# Patient Record
Sex: Male | Born: 1955 | Race: Black or African American | Hispanic: No | Marital: Single | Smoking: Former smoker
Health system: Southern US, Community
[De-identification: ages and names within clinical notes are randomized; demographics above are authoritative.]

---

## 2004-05-07 ENCOUNTER — Emergency Department (HOSPITAL_COMMUNITY): Admission: EM | Admit: 2004-05-07 | Discharge: 2004-05-07 | Payer: Self-pay | Admitting: Emergency Medicine

## 2009-10-23 ENCOUNTER — Emergency Department: Payer: Self-pay | Admitting: Emergency Medicine

## 2014-02-11 ENCOUNTER — Observation Stay: Payer: Self-pay | Admitting: Specialist

## 2014-02-11 LAB — URINALYSIS, COMPLETE
Bacteria: NONE SEEN
Bilirubin,UR: NEGATIVE
Blood: NEGATIVE
Glucose,UR: NEGATIVE mg/dL (ref 0–75)
Hyaline Cast: 8
Ketone: NEGATIVE
Nitrite: NEGATIVE
Ph: 5 (ref 4.5–8.0)
Protein: NEGATIVE
RBC,UR: 4 /HPF (ref 0–5)
Specific Gravity: 1.015 (ref 1.003–1.030)
Squamous Epithelial: 1
WBC UR: 40 /HPF (ref 0–5)

## 2014-02-11 LAB — BASIC METABOLIC PANEL
Anion Gap: 6 — ABNORMAL LOW (ref 7–16)
BUN: 19 mg/dL — ABNORMAL HIGH (ref 7–18)
Calcium, Total: 7.2 mg/dL — ABNORMAL LOW (ref 8.5–10.1)
Chloride: 103 mmol/L (ref 98–107)
Co2: 24 mmol/L (ref 21–32)
Creatinine: 1.34 mg/dL — ABNORMAL HIGH (ref 0.60–1.30)
EGFR (African American): >60
EGFR (Non-African Amer.): 59 — ABNORMAL LOW
Glucose: 80 mg/dL (ref 65–99)
Osmolality: 268 (ref 275–301)
Potassium: 4 mmol/L (ref 3.5–5.1)
Sodium: 133 mmol/L — ABNORMAL LOW (ref 136–145)

## 2014-02-11 LAB — ETHANOL
Ethanol %: 0.083 % — ABNORMAL HIGH (ref 0.000–0.080)
Ethanol: 83 mg/dL

## 2014-02-11 LAB — COMPREHENSIVE METABOLIC PANEL
Albumin: 3.7 g/dL (ref 3.4–5.0)
Alkaline Phosphatase: 72 U/L
Anion Gap: 12 (ref 7–16)
BUN: 29 mg/dL — ABNORMAL HIGH (ref 7–18)
Bilirubin,Total: 0.8 mg/dL (ref 0.2–1.0)
Calcium, Total: 8.8 mg/dL (ref 8.5–10.1)
Chloride: 93 mmol/L — ABNORMAL LOW (ref 98–107)
Co2: 23 mmol/L (ref 21–32)
Creatinine: 2.7 mg/dL — ABNORMAL HIGH (ref 0.60–1.30)
EGFR (African American): 29 — ABNORMAL LOW
EGFR (Non-African Amer.): 25 — ABNORMAL LOW
Glucose: 96 mg/dL (ref 65–99)
Osmolality: 263 (ref 275–301)
Potassium: 5 mmol/L (ref 3.5–5.1)
SGOT(AST): 26 U/L (ref 15–37)
SGPT (ALT): 22 U/L
Sodium: 128 mmol/L — ABNORMAL LOW (ref 136–145)
Total Protein: 8.4 g/dL — ABNORMAL HIGH (ref 6.4–8.2)

## 2014-02-11 LAB — TROPONIN I
Troponin-I: <0.02 ng/mL
Troponin-I: <0.02 ng/mL
Troponin-I: <0.02 ng/mL

## 2014-02-11 LAB — CBC
HCT: 50.5 % (ref 40.0–52.0)
HGB: 16.8 g/dL (ref 13.0–18.0)
MCH: 29.9 pg (ref 26.0–34.0)
MCHC: 33.3 g/dL (ref 32.0–36.0)
MCV: 90 fL (ref 80–100)
Platelet: 169 10*3/uL (ref 150–440)
RBC: 5.63 10*6/uL (ref 4.40–5.90)
RDW: 14.9 % — ABNORMAL HIGH (ref 11.5–14.5)
WBC: 11.8 10*3/uL — ABNORMAL HIGH (ref 3.8–10.6)

## 2014-02-11 LAB — CK-MB
CK-MB: 0.6 ng/mL (ref 0.5–3.6)
CK-MB: 1 ng/mL (ref 0.5–3.6)

## 2014-02-11 LAB — DRUG SCREEN, URINE

## 2014-02-11 LAB — CK TOTAL AND CKMB (NOT AT ARMC)
CK, Total: 199 U/L
CK-MB: <0.5 ng/mL — ABNORMAL LOW (ref 0.5–3.6)

## 2014-02-12 ENCOUNTER — Inpatient Hospital Stay: Payer: Self-pay | Admitting: Psychiatry

## 2014-02-12 LAB — LIPID PANEL
Cholesterol: 97 mg/dL (ref 0–200)
HDL Cholesterol: 39 mg/dL — ABNORMAL LOW (ref 40–60)
Ldl Cholesterol, Calc: 40 mg/dL (ref 0–100)
Triglycerides: 89 mg/dL (ref 0–200)
VLDL Cholesterol, Calc: 18 mg/dL (ref 5–40)

## 2014-02-12 LAB — TSH: Thyroid Stimulating Horm: 1.3 u[IU]/mL

## 2014-02-13 LAB — FOLATE: Folic Acid: 14.8 ng/mL (ref 3.1–100.0)

## 2014-02-14 LAB — BASIC METABOLIC PANEL
Anion Gap: 8 (ref 7–16)
BUN: 7 mg/dL (ref 7–18)
Calcium, Total: 8.7 mg/dL (ref 8.5–10.1)
Chloride: 105 mmol/L (ref 98–107)
Co2: 28 mmol/L (ref 21–32)
Creatinine: 0.91 mg/dL (ref 0.60–1.30)
EGFR (African American): >60
EGFR (Non-African Amer.): >60
Glucose: 89 mg/dL (ref 65–99)
Osmolality: 279 (ref 275–301)
Potassium: 4 mmol/L (ref 3.5–5.1)
Sodium: 141 mmol/L (ref 136–145)

## 2014-02-28 LAB — CBC
HCT: 46.5 % (ref 40.0–52.0)
HGB: 15.4 g/dL (ref 13.0–18.0)
MCH: 29.8 pg (ref 26.0–34.0)
MCHC: 33 g/dL (ref 32.0–36.0)
MCV: 90 fL (ref 80–100)
Platelet: 223 10*3/uL (ref 150–440)
RBC: 5.15 10*6/uL (ref 4.40–5.90)
RDW: 15.3 % — ABNORMAL HIGH (ref 11.5–14.5)
WBC: 9.2 10*3/uL (ref 3.8–10.6)

## 2014-02-28 LAB — DRUG SCREEN, URINE
Amphetamines, Ur Screen: NEGATIVE (ref ?–1000)
Barbiturates, Ur Screen: NEGATIVE (ref ?–200)
Benzodiazepine, Ur Scrn: NEGATIVE (ref ?–200)
Cannabinoid 50 Ng, Ur ~~LOC~~: POSITIVE (ref ?–50)
Cocaine Metabolite,Ur ~~LOC~~: NEGATIVE (ref ?–300)
MDMA (Ecstasy)Ur Screen: NEGATIVE (ref ?–500)
Methadone, Ur Screen: NEGATIVE (ref ?–300)
Opiate, Ur Screen: NEGATIVE (ref ?–300)
Phencyclidine (PCP) Ur S: NEGATIVE (ref ?–25)
Tricyclic, Ur Screen: NEGATIVE (ref ?–1000)

## 2014-02-28 LAB — TSH: Thyroid Stimulating Horm: 2.04 u[IU]/mL

## 2014-02-28 LAB — COMPREHENSIVE METABOLIC PANEL
Albumin: 3.3 g/dL — ABNORMAL LOW (ref 3.4–5.0)
Alkaline Phosphatase: 74 U/L
Anion Gap: 9 (ref 7–16)
BUN: 6 mg/dL — ABNORMAL LOW (ref 7–18)
Bilirubin,Total: 0.7 mg/dL (ref 0.2–1.0)
Calcium, Total: 8.4 mg/dL — ABNORMAL LOW (ref 8.5–10.1)
Chloride: 98 mmol/L (ref 98–107)
Co2: 25 mmol/L (ref 21–32)
Creatinine: 0.9 mg/dL (ref 0.60–1.30)
EGFR (African American): >60
EGFR (Non-African Amer.): >60
Glucose: 119 mg/dL — ABNORMAL HIGH (ref 65–99)
Osmolality: 263 (ref 275–301)
Potassium: 3.3 mmol/L — ABNORMAL LOW (ref 3.5–5.1)
SGOT(AST): 22 U/L (ref 15–37)
SGPT (ALT): 14 U/L
Sodium: 132 mmol/L — ABNORMAL LOW (ref 136–145)
Total Protein: 7.5 g/dL (ref 6.4–8.2)

## 2014-02-28 LAB — URINALYSIS, COMPLETE
Bacteria: NONE SEEN
Bilirubin,UR: NEGATIVE
Blood: NEGATIVE
Glucose,UR: >=500 mg/dL (ref 0–75)
Hyaline Cast: 2
Ketone: NEGATIVE
Nitrite: NEGATIVE
Ph: 5 (ref 4.5–8.0)
Protein: NEGATIVE
RBC,UR: 4 /HPF (ref 0–5)
Specific Gravity: 1.016 (ref 1.003–1.030)
Squamous Epithelial: 1
WBC UR: 33 /HPF (ref 0–5)

## 2014-02-28 LAB — SALICYLATE LEVEL: Salicylates, Serum: 4.4 mg/dL — ABNORMAL HIGH

## 2014-02-28 LAB — ETHANOL
Ethanol %: <0.003 % (ref 0.000–0.080)
Ethanol: <3 mg/dL

## 2014-02-28 LAB — ACETAMINOPHEN LEVEL: Acetaminophen: <2 ug/mL

## 2014-03-01 ENCOUNTER — Inpatient Hospital Stay: Payer: Self-pay | Admitting: Psychiatry

## 2014-03-02 LAB — LIPID PANEL
Cholesterol: 130 mg/dL (ref 0–200)
HDL Cholesterol: 66 mg/dL — ABNORMAL HIGH (ref 40–60)
Ldl Cholesterol, Calc: 46 mg/dL (ref 0–100)
Triglycerides: 92 mg/dL (ref 0–200)
VLDL Cholesterol, Calc: 18 mg/dL (ref 5–40)

## 2014-03-02 LAB — HEMOGLOBIN A1C: Hemoglobin A1C: 5.9 % (ref 4.2–6.3)

## 2014-03-03 LAB — URINE CULTURE

## 2014-10-30 NOTE — Consult Note (Signed)
Psychiatry: After my first eval, nursing informed me patient is complaining of hallucinations and seems paranoid. On re-eval he agrees though he doesnt seem to remember it very well. I think this is prob dementia with psychotic symptoms "sundowning" . They are looking for placement. If we have a bed, which we may or may not, we can transfer him to psychiatry. Meanwhile continue haldol here as ordered. I did advise the psychiatry intake coordinator, though, that we can transfer him if possible.  Electronic Signatures: Gonzella Lex (MD)  (Signed on 06-Aug-15 16:42)  Authored  Last Updated: 06-Aug-15 16:42 by Gonzella Lex (MD)

## 2014-10-30 NOTE — H&P (Signed)
PATIENT NAME:  Nathan Mcintosh, Nathan Mcintosh MR#:  [id number] DATE OF BIRTH:  1955/11/22  DATE OF ADMISSION:  03/01/2014  REFERRING PHYSICIAN: Emergency Room MD   ATTENDING PHYSICIAN: Kristin Lamagna B. Bary Leriche, MD   IDENTIFYING DATA: Mr. Mcgibbon is a 59 year old male with history of alcoholism and mood instability.   CHIEF COMPLAINT: "I took pills."   HISTORY OF PRESENT ILLNESS: Mr. Slatter was hospitalized at [facility] for chest pain, alcohol detoxification and depression with psychotic features at the beginning of August. He was discharged from psychiatry on 08/11 with prescription for Prozac and Haldol for depression and hallucinations. He was discharged to the homeless shelter; however, he was not accepted there and returned to his previous poor living situation. For the past years, he has been living in an abandoned car. He has no income, no insurance and very little support in the area even though his daughter and his grandchildren live here. He did not take medications as prescribed following discharge, but overdosed on his 7 day supply prior to coming to the Emergency Room again. He complains of suicidal ideation and depression. He also reported auditory hallucinations, but was unable to make out the content of the voices. He really does not understand what made his situation so much worse, but he is tired of living and feels that he is unable to press on anymore. He reports many symptoms of depression with poor sleep, decreased appetite, anhedonia, feeling of guilt, worthlessness, hopelessness, crying spells, poor energy and concentration, social isolation and now suicidal ideation leading to a suicide attempt by overdose. The patient has a long history of drinking. He is without resources so has not been drinking much, maybe 2 or 3 beers a day. On some days, he gets drunk when he gets access to alcohol. He denies any other substance use. He denies symptoms suggestive of bipolar mania.    PAST PSYCHIATRIC HISTORY: He had 1 recent hospitalization for alcohol detoxification and psychotic depression. He did not follow up with [facility], mostly because he was no longer living at the shelter as planned. He is ready to start working with mental health provider and substance abuse counselor no. However, his social situation is of concern. I am still unsure if he would be able to go to the homeless shelter in [place]. If we send him to the shelter in [place] he will have no support of [facility] community support team. He denies ever attempting suicide before. He has never been in treatment for substance abuse.   FAMILY PSYCHIATRIC HISTORY: None reported.   PAST MEDICAL HISTORY: Failure to thrive.   ALLERGIES: No known drug allergies.   MEDICATIONS: On admission, none. He was prescribed Remeron 15 mg at bedtime and Haldol 2 mg at bedtime.   SOCIAL HISTORY: As above, he is unemployed, uninsured. He is homeless.   REVIEW OF SYSTEMS: CONSTITUTIONAL: No fevers or chills. Positive for weight loss and fatigue.  EYES: No double or blurred vision.  EARS, NOSE AND THROAT: No hearing loss.  RESPIRATORY: No shortness of breath or cough.  CARDIOVASCULAR: No chest pain or orthopnea.  GASTROINTESTINAL: No abdominal pain, nausea, vomiting or diarrhea.  GENITOURINARY: No incontinence or frequency.  ENDOCRINE: No heat or cold intolerance.  LYMPHATIC: No anemia or easy bruising.  INTEGUMENTARY: No acne or rash.  MUSCULOSKELETAL: No muscle or joint pain.  NEUROLOGIC: No tingling or weakness.  PSYCHIATRIC: See history of present illness for details.   PHYSICAL EXAMINATION:  VITAL SIGNS: Blood pressure 136/82, pulse  126, respirations 20, temperature 98.2.  GENERAL: This is an  undernourished man looking older than stated age, in no acute distress.  HEENT: The pupils are equal, round and reactive to light. Sclerae are anicteric.  NECK: Supple. No thyromegaly.  LUNGS: Clear to auscultation. No  dullness to percussion.  HEART: Regular rhythm and rate. No murmurs, rubs or gallops.  ABDOMEN: Soft, nontender, nondistended. Positive bowel sounds.  MUSCULOSKELETAL: Normal muscle strength in all extremities.  SKIN: No rashes or bruises.  LYMPHATIC: No cervical adenopathy.  NEUROLOGIC: Cranial nerves II-XII are intact.   LABORATORY DATA: Chemistries: Blood glucose 119, BUN 6, creatinine 0.9, sodium 132, potassium 3.3. Blood alcohol level is zero. LFTs within normal limits. TSH 2.04. Urine toxicology screen positive for cannabinoids. CBC within normal limits. Urinalysis: Glucose over 500, leukocyte esterase 1+, white cells per field 33. Serum acetaminophen less than 2. Serum salicylates 4.4.   MENTAL STATUS EXAMINATION ON ADMISSION: The patient is asleep but easily arousable. He is pleasant, polite and cooperative. He is adequately groomed, wearing hospital scrubs. He remembers me from previous admission. He maintains limited eye contact. His speech is soft. There is poverty of speech. Mood is depressed with flat affect. Thought process is logical and goal oriented. He denies thoughts of hurting himself or others and is able to contract for safety in the hospital, but came after a suicide attempt by overdose. He is not paranoid or delusional but endorses auditory hallucinations. His cognition is grossly intact. Registration, recall, short and long-term memory are okay. He is of average intelligence and fund of knowledge. His insight and judgment are limited.   SUICIDE RISK ASSESSMENT ON ADMISSION: This is a patient with a long history of alcoholism and new onset depression and psychosis.   INITIAL DIAGNOSES:  AXIS I: Major depressive disorder, recurrent, severe with psychotic features; alcohol dependence; cannabis abuse.  AXIS II: Deferred.  AXIS III: Failure to thrive, urinary tract infection.  AXIS IV: Mental and physical illness, access to care, employment, financial, housing, primary support.   AXIS V: Global assessment of functioning 25.   PLAN: The patient was admitted to [facility] for safety, stabilization and medication management. He was initially placed on suicide precautions and was closely monitored for any unsafe behaviors. He underwent full psychiatric and risk assessment. He received pharmacotherapy, individual and group psychotherapy, substance abuse counseling, and support from therapeutic milieu.  1.  Suicidal ideation: The patient is able to contract for safety.  2.  We will continue antidepressant and antipsychotic for now.  3.  UTI. We will obtain urine culture.  4.  Diabetes. We will obtain hemoglobin A1C and blood glucose monitoring to see what the appropriate treatment is.   DISPOSITION: To be established.   ____________________________ Wardell Honour. Bary Leriche, MD jbp:TT D: 03/01/2014 16:00:54 ET T: 03/01/2014 16:36:25 ET JOB#: [id number]  cc: Starsha Morning B. Bary Leriche, MD, <Dictator> Clovis Fredrickson MD ELECTRONICALLY SIGNED 03/02/2014 19:13

## 2014-10-30 NOTE — Consult Note (Signed)
Brief Consult Note: Diagnosis: alcohol abuse, dementia.   Patient was seen by consultant.   Consult note dictated.   Discussed with Attending MD.   Comments: Transfer to psychiatry.  Electronic Signatures: Orson Slick (MD)  (Signed 07-Aug-15 16:31)  Authored: Brief Consult Note   Last Updated: 07-Aug-15 16:31 by Orson Slick (MD)

## 2014-10-30 NOTE — Consult Note (Signed)
Psychiatry: Consult note for this patient who was brought into the emergency room complaining of chest pain.  Concern is about reports of hallucinations. of present illness: This gentleman presented himself to the emergency room stating he was having chest pain.  As he describes it to me is been going on for months and is localized to a spot on the left side just below his rib cage.  Also when he was riding his bicycle he started seeing spots before his eyes.  He denied to me that he was having any other hallucinations.  Not seeing people not hearing voices.  Patient stated that his mood was okay.  Did not report suicidal or homicidal ideation.  He was not a particularly good historian but was not presenting in my initial interview with acute psychotic symptoms.  After I finished my initial evaluation me he started telling the nurses that he was frightened that people were trying to hurt him.  He didn't want nurses to leave the room.  He indicated that he was having auditory hallucinations.  It acting more bizarre.  When I went back to evaluate him again he admitted that he was afraid and was thinking that there were people out to get him.  Still did not report any specific mood symptoms.  Patient admits that he drinks on a regular basis.  He can't be clear how much 2 or 3 or more beers a day and probably more alcohol if he can.  Denies that he is abusing other drugs. psychiatric history: Claims that he's never had any psychiatric treatment in the past.  Never been hospitalized denies any suicide attempts. abuse history: Appears to have a chronic problem with drinking.  Admits that he drinks heavily on a daily basis and has been doing so for years.  Denies seizures or delirium tremens.  Past medical history: No known ongoing medical problems. history: Evidently he has nowhere at all to live.  He says that sometimes he stays in a car or truck sometimes he sleeps outside and sometimes he stays with other people.   We confirmed that he is not allowed to go back to the homeless shelter.  He does have family in the area but they don't stay in close contact with him and don't appear to be able to provide any assistance. of systems: He endorses having paranoid thoughts and some auditory hallucinations.  Denies suicidal or homicidal ideation.  Denies mood symptoms.  Says that his chest pain is all better and is no longer short of breath. status exam: Somewhat disheveled underweight small gentleman who looks his stated age.  Cooperative with the interview.  Good eye contact.  Psychomotor activity normal.  Speech is slightly slurred but easy to understand.  Decrease in total amount.  Affect looks confused and puzzled at times.  Mood stated as all right.  Thoughts are a bit confused and I think he tends to confabulate a little.  Endorsed finally that he was having some problems with hearing things and felt paranoid.  Denied suicidal or homicidal thoughts.  Patient was unable to remember any objects at 2 minutes.  Clearly seems to be cognitively impaired. This is a gentleman who initially presented for physical complaints but now those are resolved and we are left with some psychopathology.  He is paranoid infused demented probably.  My guess is this gentleman has dementia probably related to alcohol abuse on top of what might be a developmental disability.  Currently he has no safe  place to live and no place we can safely discharge him to.  Social work is working on trying to get him Medicaid pending admitted to a family care home. plan: Because he no longer requires specific medical treatment and yet is not dischargeable we can transfer him to psychiatry.  I have started him on a low-dose of haloperidol.  Follow-up for further psychiatric symptoms. 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  With psychotic symptoms.  Time spent on exam 50 minutes  Electronic Signatures: Clapacs, Madie Reno (MD)  (Signed  on 07-Aug-15 16:47)  Authored  Last Updated: 07-Aug-15 16:47 by Gonzella Lex (MD)

## 2014-10-30 NOTE — Consult Note (Signed)
Psychiatry: After my consult patient started reveling more psychotic symptooms including paranoia and believing peaple were talking about him. Also expressing fear of being alone. Shelter wont take him so hospital will likely work on getting medicaid pending and try placement which means he will likely be here a while.  possibly dementia related hallucinations and paranoia vs dts vs other psychotic disorder. Will start haldol 1mg  bid and follow.  Electronic Signatures: Gonzella Lex (MD)  (Signed on 06-Aug-15 15:20)  Authored  Last Updated: 06-Aug-15 15:20 by Gonzella Lex (MD)

## 2014-10-30 NOTE — H&P (Signed)
PATIENT NAME:  Nathan Mcintosh, Nathan Mcintosh MR#:  [id number] DATE OF BIRTH:  18-Sep-1955  DATE OF ADMISSION:  02/11/2014  REFERRING PHYSICIAN: Monica Becton, MD.    ATTENDING PHYSICIAN: Ragna Kramlich B. Bary Leriche, MD.  IDENTIFYING DATA: Nathan Mcintosh is a 59 year old male with history of alcoholism.   CHIEF COMPLAINT: "I'm losing weight."   HISTORY OF PRESENT ILLNESS: Nathan Mcintosh was admitted to the medical floor on August 6 for chest pain. He was ruled out for cardiac event. It was noticed that the patient complains of abdominal pain and weight loss. CT scan of abdomen was unremarkable. The patient presented with hallucinations and paranoia. Psychiatry was brought on board. Dr. Weber Cooks, who did a consultation yesterday, recommended transfer to psychiatry for treatment of alcoholism, possibly dementia, and psychosis. The patient reports that in the past 2 months he became increasingly paranoid. He does sleep in a truck, has no place to live, so naturally it is quite possible that he feels unsafe. It started worrying him that he feels people are out to get him. During the same period of time, he started experiencing hallucinations and hears people talking about hurting him. He cannot distinguish the voices. The hallucinations do not happen all the time, but have not been going away. He reports that he has been drinking all of his life, recently 2 or 3 beers. He is also a smoker. He endorses some symptoms of depression with poor sleep, decreased appetite, weight loss, feelings of guilt, poor energy and concentration, social isolation, but denies other symptoms. No crying. No thoughts of suicide. No feelings of guilt, hopelessness, or worthlessness. He is in a difficult social situation, had to move out of the house that he occupied after the house was sold, stating the fact that it belongs to his friend. He has no income. He denies, other than alcohol substance use, no symptoms suggestive of bipolar mania. No heightened  anxiety.   PAST PSYCHIATRIC HISTORY: He has never been hospitalized. No suicide attempts. Never treated for substances.   FAMILY PSYCHIATRIC HISTORY: None reported.   PAST MEDICAL HISTORY: Chest pain on the current admission, weight loss, and abdominal pain.   ALLERGIES: No known drug allergies.   MEDICATIONS AT HOME: None.   MEDICATIONS AT THE TIME OF TRANSFER: Tylenol as needed, Haldol 1 mg twice daily, Haldol 1 mg q. 4 hours for agitation, heparin every 12 hours, lorazepam for symptoms of withdrawal, nitroglycerin 2% ointment, Zofran as needed, and thiamine.   SOCIAL HISTORY: He lives in a car. He tells me that he has never been employed, has no income, supports himself from odd jobs.   REVIEW OF SYSTEMS:  CONSTITUTIONAL: No fevers or chills. No weight changes.  EYES: No double or blurred vision.  ENT: No hearing loss.  RESPIRATORY: No shortness of breath or cough.  CARDIOVASCULAR: No chest pain or orthopnea.  GASTROINTESTINAL: No abdominal pain, nausea, vomiting, or diarrhea.  GENITOURINARY: No incontinence or frequency.  ENDOCRINE: No heat or cold intolerance.  LYMPHATIC: No anemia or easy bruising.  INTEGUMENTARY: No acne or rash.  MUSCULOSKELETAL: No muscle or joint pain.  NEUROLOGIC: No tingling or weakness.  PSYCHIATRIC: See history of present illness for details.   PHYSICAL EXAMINATION: VITAL SIGNS: Blood pressure 124/87, pulse 82, respirations 18, temperature 98.9.  GENERAL: This is a slender gentleman looking older than stated age, in no acute distress.  HEENT: The pupils are equal, round, and reactive to light. Sclerae are anicteric.  NECK: Supple. No thyromegaly.  LUNGS: Clear to auscultation.  No dullness to percussion.  HEART: Regular rhythm and rate. No murmurs, rubs, or gallops.  ABDOMEN: Soft, nontender, nondistended. Positive bowel sounds.  MUSCULOSKELETAL: Normal muscle strength in all extremities.  SKIN: No rashes or bruises.  LYMPHATIC: No cervical  adenopathy.  NEUROLOGIC: Cranial nerves II through XII are intact.   LABORATORY DATA: EKG is in the only available laboratory with sinus tachycardia, right atrial enlargement, and left axis deviation, abnormal EKG. Chest x-ray not remarkable. Abdomen CT scan unremarkable. Hemogram within normal limits with white blood count of 11.8. Urinalysis is suggestive of urinary tract infection with 2+ leukocyte esterase and 40 white cells per field.   MENTAL STATUS EXAMINATION ON ADMISSION: The patient is alert and oriented to person, place, time, and situation. He is pleasant, polite, and cooperative, although a little disrespectful. He is on the phone waiting for someone to answer and refuses to talk to me. He has a long list of phone numbers neatly written on a page trying to call his friends. Eventually he does participate in the interview. He is the neatly groomed, wearing hospital gown. He maintains good eye contact. His speech is of normal rhythm, rate, and volume. Mood is depressed with flat affect. Thought process logical and goal oriented. Thought content, he denies suicidal or homicidal ideation. He endorses paranoid delusions and auditory hallucinations. His cognition is difficult to assess, as he is unable to participate in the cognitive part of examination. He seems of normal intelligence and fund of knowledge. His insight and judgment are questionable.   INITIAL DIAGNOSES:  AXIS I: Alcohol dependence, tobacco dependence, substance-induced psychosis, cognitive decline not otherwise specified.  AXIS II: Deferred.  AXIS III: Failure to thrive.  AXIS IV: Substance abuse, employment, financial, housing, primary support, access to care.  AXIS V: Global assessment of functioning 35.   PLAN: The patient was admitted to [facility] for safety, stabilization, and medication management. He was initially placed on suicide precautions and was closely monitored for  any unsafe behaviors. He underwent full psychiatric and risk assessment. He received pharmacotherapy, individual and group psychotherapy, substance abuse counseling, and support from therapeutic milieu.  1.  Psychosis. He was started on Haldol on the medical floor, will continue.  2.  Alcohol dependence. He is on some Ativan, will probably switch it to a Librium taper.  3.  Social. The patient has multiple social problems, but it seems like he has been taking good care of himself with the help of all of his friends from the list.   DISPOSITION: To be established. He probably will follow up with [facility] as they have an intensive outpatient substance abuse program.     ____________________________ Wardell Honour. Bary Leriche, MD jbp:at D: 02/12/2014 17:23:05 ET T: 02/12/2014 17:53:35 ET JOB#: [id number]  cc: Sister Carbone B. Bary Leriche, MD, <Dictator> Clovis Fredrickson MD ELECTRONICALLY SIGNED 02/20/2014 2:32

## 2014-10-30 NOTE — Consult Note (Signed)
PATIENT NAME:  Nathan Mcintosh, Nathan Mcintosh MR#:  [id number] DATE OF BIRTH:  [birth year]  DATE OF CONSULTATION:  02/28/2014  REFERRING PHYSICIAN:   CONSULTING PHYSICIAN:  Myan Locatelli K. Hebah Bogosian, MD  SUBJECTIVE: The patient was seen in consultation in [facility] Emergency Room. The patient is a 59 year old African American male who is not employed, last worked many years ago. The patient lives by himself in a truck. The patient came to [facility] Emergency Room after he stated that he had been hearing voices and then he took too many prescription pills or pain pills that were given to him for his pain radiating down the right side of his hand and shoulders.  HISTORY OF PRESENT ILLNESS: The patient reports that he has been feeling depressed and he still considers himself suicidal and could not contract for safety.   PAST PSYCHIATRIC HISTORY: Inpatient psychiatry before. Not being followed by any psychiatrist at this time.  ALCOHOL AND DRUGS: Admits that he has been drinking alcohol at the rate of 2-3 forty ounces of beer per day for quite some time. No history of DWIs. No history of public drunkenness. Denies any other street or prescription drug abuse. Does admit smoking nicotine cigarettes at the rate of a pack a day. 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  MENTAL STATUS EXAMINATION: The patient is alert and oriented. Appears anxious. Affect is flat. Mood restricted. Admits feeling hopeless and helpless. Admits feeling depressed. Does admit to suicidal wishes and thoughts and cannot contract for safety though he has no definite plans. In relation, he has been seeing things and he sees dead people that are not there. Insight and judgment guarded. Impulse control is poor.  IMPRESSION: Major depressive disorder with psychosis.  RECOMMENDATIONS: Inpatient to psychiatry for a total observation, evaluation and  help.   ____________________________ Wallace Cullens. Franchot Mimes, MD skc:TT D: 02/28/2014 19:54:11 ET T: 02/28/2014 20:54:52 ET JOB#: [id number]  cc: Arlyn Leak K. Franchot Mimes, MD, <Dictator> Dewain Penning MD ELECTRONICALLY SIGNED 03/06/2014 14:11

## 2014-10-30 NOTE — Consult Note (Signed)
Brief Consult Note: Diagnosis: alcohol abuse, dementia.   Patient was seen by consultant.   Consult note dictated.   Discussed with Attending MD.   Comments: PSychiatry: Patient seen and chart reviewed. Patient currently not reporting any psychotic symptoms. Mood fine. No SI or HI and no acute dangerousness. PAtient drinks and prob has dementia at least in part related to it. No need for IVC. Discussed alcohol abuse with him. SW helping with possible living options.  Electronic Signatures: Gonzella Lex (MD)  (Signed 06-Aug-15 14:14)  Authored: Brief Consult Note   Last Updated: 06-Aug-15 14:14 by Gonzella Lex (MD)

## 2014-10-30 NOTE — H&P (Signed)
PATIENT NAME:  [name]:  [id number] DATE OF BIRTH:  Nov 29, 1955  DATE OF ADMISSION:  02/10/2014  PRIMARY CARE PHYSICIAN: None.   REFERRING PHYSICIAN: Dr. Lenise Arena.   CHIEF COMPLAINT: Chest pain.   HISTORY OF PRESENT ILLNESS: [name] Nathan Mcintosh is a 59 year old African-American male with no past medical history. Comes to the Emergency Department with complaints of chest pain on the left lateral aspect of the chest for the last 2-3 weeks off and on. The pain is sharp in nature, worse especially with bending. Denies having any shortness of breath. Denies having any radiation. Denies having any nausea, vomiting, lightheadedness or diaphoresis. The pain is not worse with any exertion. Concerning this, he came to the Emergency Department. Work-up in the Emergency Department, the patient is found to have mild renal insufficiency. Initial set of  cardiac enzymes are negative. Chest x-ray is unremarkable. The patient denies having any previous episodes of chest pain. The patient smokes 2 packs of cigarettes a day, drinks alcohol or 2 to 3 beers a day.   PAST MEDICAL HISTORY: None.   PAST SURGICAL HISTORY: None.   ALLERGIES: No known drug allergies.   HOME MEDICATIONS: None.   SOCIAL HISTORY: Continues to smoke 2 packs a day. Drinks alcohol 2 to 3 beers a day. Denies using any illicit drugs. Lives by himself. Does odd jobs.    FAMILY HISTORY: Could not tell if any obvious health problems run in the family.   REVIEW OF SYSTEMS:  CONSTITUTIONAL: States that has been on losing weight despite eating well.  EYES: No change in vision. EARS, NOSE AND THROAT:  No change in hearing.  RESPIRATORY: No cough, shortness of breath.  CARDIOVASCULAR: Has chest pain.  GASTROINTESTINAL: Is no nausea, vomiting, abdominal pain.  GENITOURINARY: No dysuria or hematuria.  HEMATOLOGIC: No easy bruising or bleeding.  SKIN: No rash or lesions.  MUSCULOSKELETAL: No joint pains and aches.  ENDOCRINE: No  polyuria or polydipsia.  NEUROLOGIC: No weakness or numbness in any part of the body.   PHYSICAL EXAMINATION:  GENERAL: This is a thin built male looks much older than the stated age lying down in the bed, not in distress.  VITAL SIGNS: Temperature 98.4, pulse 91, blood pressure 134/98, respiratory rate of 18, oxygen saturation is 98% on room air.  HEENT: Head normocephalic, atraumatic. No scleral icterus. Conjunctivae normal. Pupils equal and react to light. Extraocular movements are intact. Mucous membranes moist. No pharyngeal erythema.  NECK: Supple. No lymphadenopathy. No JVD. No carotid bruit.  CHEST: Has no focal tenderness.  LUNGS: Bilaterally clear to auscultation.  HEART: S1 and S2 regular. No murmurs are heard.  ABDOMEN: Bowel sounds present. Soft, nontender, nondistended. No hepatosplenomegaly.  EXTREMITIES: No pedal edema. Pulses 2+.  NEUROLOGIC: The patient is alert and oriented to place, person, and time. Cranial nerves II through XII intact. Motor 5/5 in upper and lower extremities.   LABORATORY DATA: CBC: WBC of 11.8, hemoglobin 16.8, platelet count 169,000.   Complete metabolic panel: BUN 29, creatinine of 2.70, potassium 5. Rest of all the values are within normal limits. Initial set of cardiac enzymes are negative.   EKG, 12-lead: Normal sinus rhythm with no ST-T wave abnormalities.   Chest x-ray, one view portable: No acute cardiopulmonary disease.   ASSESSMENT AND PLAN: [name] Nathan Mcintosh is a 59 year old male who comes to the Emergency Department with complaints of chest pain.    1. Chest pain. This does not seem to be cardiac origin, however, considering the  patient's multiple risk factors admit the patient to a monitored bed, cycle cardiac enzymes x 3. However, also considering report of the patient losing weight, the pain is more in the left lateral aspect of the abdomen with loss of weight, we will also obtain CT abdomen and pelvis which should also cover the lower part  of the lungs.  We will also obtain lipid profile.  2. Acute renal failure. We do not have any baseline. We will keep the patient on IV fluids and follow up. The patient denies taking any over-the-counter pain medications.  3. Alcohol abuse. Keep the patient on thiamine. Continue with follow if any signs of alcohol withdrawal.  4. Continued tobacco use. I counseled the patient, however seems to have poor insight.  5. Keep the patient on deep vein thrombosis prophylaxis with Lovenox.   TIME SPENT: 50 minutes.    ____________________________ Monica Becton, MD pv:jh D: 02/11/2014 03:14:27 ET T: 02/11/2014 03:35:49 ET JOB#: [id number]  cc: Monica Becton, MD, <Dictator> Monica Becton MD ELECTRONICALLY SIGNED 02/11/2014 21:14

## 2014-11-16 NOTE — H&P (Signed)
PATIENT NAMEMarland Mcintosh  JANDEL, PATRIARCA [id number] OF BIRTH:  1956/06/16 OF ADMISSION:  02/11/2014 PHYSICIAN: Monica Becton, MD.   PHYSICIAN: Jarrick Fjeld B. Denyce Harr, MD. DATA: Mr. Ihnen is a 59 year old male with history of alcoholism.  COMPLAINT: "I?m losing weight."  OF PRESENT ILLNESS: Mr. Fontan was admitted to the medical floor on August 6 for chest pain. He was ruled out for cardiac event. It was noticed that the patient complains of abdominal pain and weight loss. CT scan of abdomen was unremarkable. The patient presented with hallucinations and paranoia. Psychiatry was brought on board. Dr. Weber Cooks, who did a consultation yesterday, recommended transfer to psychiatry for treatment of alcoholism, possibly dementia, and psychosis. The patient reports that in the past 2 months he became increasingly paranoid. He does sleep in a truck, has no place to live, so naturally it is quite possible that he feels unsafe. It started worrying him that he feels people are out to get him. During the same period of time, he started experiencing hallucinations and hears people talking about hurting him. He cannot distinguish the voices. The hallucinations do not happen all the time, but have not been going away. He reports that he has been drinking all of his life, recently 2 or 3 beers. He is also a smoker. He endorses some symptoms of depression with poor sleep, decreased appetite, weight loss, feelings of guilt, poor energy and concentration, social isolation, but denies other symptoms. No crying. No thoughts of suicide. No feelings of guilt, hopelessness, or worthlessness. He is in a difficult social situation, had to move out of the house that he occupied after the house was sold, stating the fact that it belongs to his friend. He has no income. He denies, other than alcohol substance use, no symptoms suggestive of bipolar mania. No heightened anxiety.  PSYCHIATRIC HISTORY: He has never been hospitalized. No suicide  attempts. Never treated for substances.  PSYCHIATRIC HISTORY: None reported.  MEDICAL HISTORY: Chest pain on the current admission, weight loss, and abdominal pain.  No known drug allergies.  AT HOME: None.  AT THE TIME OF TRANSFER: Tylenol as needed, Haldol 1 mg twice daily, Haldol 1 mg q. 4 hours for agitation, heparin every 12 hours, lorazepam for symptoms of withdrawal, nitroglycerin 2% ointment, Zofran as needed, and thiamine.  HISTORY: He lives in a car. He tells me that he has never been employed, has no income, supports himself from odd jobs.  OF SYSTEMS: No fevers or chills. No weight changes. No double or blurred vision. No hearing loss. No shortness of breath or cough. No chest pain or orthopnea. No abdominal pain, nausea, vomiting, or diarrhea. No incontinence or frequency. No heat or cold intolerance. No anemia or easy bruising. No acne or rash. No muscle or joint pain. No tingling or weakness. See history of present illness for details.  EXAMINATION:SIGNS: Blood pressure 124/87, pulse 82, respirations 18, temperature 98.9. This is a slender gentleman looking older than stated age, in no acute distress. The pupils are equal, round, and reactive to light. Sclerae are anicteric. Supple. No thyromegaly. Clear to auscultation. No dullness to percussion. Regular rhythm and rate. No murmurs, rubs, or gallops. Soft, nontender, nondistended. Positive bowel sounds. Normal muscle strength in all extremities. No rashes or bruises. No cervical adenopathy. Cranial nerves II through XII are intact.  DATA: EKG is in the only available laboratory with sinus tachycardia, right atrial enlargement, and left axis deviation, abnormal EKG. Chest x-ray not remarkable. Abdomen CT scan unremarkable. Hemogram within  normal limits with white blood count of 11.8. Urinalysis is suggestive of urinary tract infection with 2+ leukocyte esterase and 40 white cells per field.  STATUS EXAMINATION ON ADMISSION: The patient is  alert and oriented to person, place, time, and situation. He is pleasant, polite, and cooperative, although a little disrespectful. He is on the phone waiting for someone to answer and refuses to talk to me. He has a long list of phone numbers neatly written on a page trying to call his friends. Eventually he does participate in the interview. He is the neatly groomed, wearing hospital gown. He maintains good eye contact. His speech is of normal rhythm, rate, and volume. Mood is depressed with flat affect. Thought process logical and goal oriented. Thought content, he denies suicidal or homicidal ideation. He endorses paranoid delusions and auditory hallucinations. His cognition is difficult to assess, as he is unable to participate in the cognitive part of examination. He seems of normal intelligence and fund of knowledge. His insight and judgment are questionable.  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 II: Deferred. III: Failure to thrive. IV: Substance abuse, employment, financial, housing, primary support, access to care. V: Global assessment of functioning 35.  The patient was admitted to [facility] for safety, stabilization, and medication management. He was initially placed on suicide precautions and was closely monitored for any unsafe behaviors. He underwent full psychiatric and risk assessment. He received pharmacotherapy, individual and group psychotherapy, substance abuse counseling, and support from therapeutic milieu.  1.  Psychosis. He was started on Haldol on the medical floor, will continue.   Alcohol dependence. He is on some Ativan, will probably switch it to a Librium taper.   Social. The patient has multiple social problems, but it seems like he has been taking good care of himself with the help of all of his friends from the list.  To be established. He  probably will follow up with [facility] as they have an intensive outpatient substance abuse program.     Electronic Signatures: Orson Slick (MD)  (Signed on 07-Aug-15 21:52)  Authored  Last Updated: 07-Aug-15 21:52 by Orson Slick (MD)

## 2014-12-24 ENCOUNTER — Emergency Department
Admission: EM | Admit: 2014-12-24 | Discharge: 2014-12-25 | Disposition: A | Discharge status: Other Acute Inpatient Hospital | Payer: Medicaid Other | Attending: Emergency Medicine | Admitting: Emergency Medicine

## 2014-12-24 ENCOUNTER — Encounter: Payer: Self-pay | Admitting: Emergency Medicine

## 2014-12-24 DIAGNOSIS — Z720 Tobacco use: Secondary | ICD-10-CM | POA: Diagnosis not present

## 2014-12-24 DIAGNOSIS — F22 Delusional disorders: Secondary | ICD-10-CM | POA: Insufficient documentation

## 2014-12-24 DIAGNOSIS — F333 Major depressive disorder, recurrent, severe with psychotic symptoms: Secondary | ICD-10-CM

## 2014-12-24 DIAGNOSIS — F1010 Alcohol abuse, uncomplicated: Secondary | ICD-10-CM

## 2014-12-24 DIAGNOSIS — F323 Major depressive disorder, single episode, severe with psychotic features: Secondary | ICD-10-CM

## 2014-12-24 DIAGNOSIS — F329 Major depressive disorder, single episode, unspecified: Secondary | ICD-10-CM | POA: Diagnosis present

## 2014-12-24 DIAGNOSIS — E46 Unspecified protein-calorie malnutrition: Secondary | ICD-10-CM

## 2014-12-24 LAB — COMPREHENSIVE METABOLIC PANEL
ALT: 50 U/L (ref 17–63)
AST: 52 U/L — ABNORMAL HIGH (ref 15–41)
Albumin: 4.3 g/dL (ref 3.5–5.0)
Alkaline Phosphatase: 84 U/L (ref 38–126)
Anion gap: 16 — ABNORMAL HIGH (ref 5–15)
BUN: 15 mg/dL (ref 6–20)
CO2: 22 mmol/L (ref 22–32)
Calcium: 9.9 mg/dL (ref 8.9–10.3)
Chloride: 95 mmol/L — ABNORMAL LOW (ref 101–111)
Creatinine, Ser: 1.13 mg/dL (ref 0.61–1.24)
GFR calc Af Amer: >60 mL/min (ref >60)
GFR calc non Af Amer: >60 mL/min (ref >60)
Glucose, Bld: 103 mg/dL — ABNORMAL HIGH (ref 65–99)
Potassium: 4.7 mmol/L (ref 3.5–5.1)
Sodium: 133 mmol/L — ABNORMAL LOW (ref 135–145)
Total Bilirubin: 0.9 mg/dL (ref 0.3–1.2)
Total Protein: 8.4 g/dL — ABNORMAL HIGH (ref 6.5–8.1)

## 2014-12-24 LAB — URINE DRUG SCREEN, QUALITATIVE (ARMC ONLY)
Amphetamines, Ur Screen: NOT DETECTED
Barbiturates, Ur Screen: NOT DETECTED
Benzodiazepine, Ur Scrn: NOT DETECTED
Cannabinoid 50 Ng, Ur ~~LOC~~: NOT DETECTED
Cocaine Metabolite,Ur ~~LOC~~: NOT DETECTED
MDMA (Ecstasy)Ur Screen: NOT DETECTED
Methadone Scn, Ur: NOT DETECTED
Opiate, Ur Screen: NOT DETECTED
Phencyclidine (PCP) Ur S: NOT DETECTED
Tricyclic, Ur Screen: NOT DETECTED

## 2014-12-24 LAB — URINALYSIS COMPLETE WITH MICROSCOPIC (ARMC ONLY)
Bilirubin Urine: NEGATIVE
Glucose, UA: NEGATIVE mg/dL
Nitrite: NEGATIVE
Protein, ur: 100 mg/dL — AB
Specific Gravity, Urine: 1.025 (ref 1.005–1.030)
pH: 5 (ref 5.0–8.0)

## 2014-12-24 LAB — CBC
HCT: 50.6 % (ref 40.0–52.0)
Hemoglobin: 16.5 g/dL (ref 13.0–18.0)
MCH: 28.8 pg (ref 26.0–34.0)
MCHC: 32.5 g/dL (ref 32.0–36.0)
MCV: 88.4 fL (ref 80.0–100.0)
Platelets: 196 10*3/uL (ref 150–440)
RBC: 5.73 MIL/uL (ref 4.40–5.90)
RDW: 16.2 % — ABNORMAL HIGH (ref 11.5–14.5)
WBC: 7.8 10*3/uL (ref 3.8–10.6)

## 2014-12-24 LAB — ETHANOL: Alcohol, Ethyl (B): <5 mg/dL (ref <5)

## 2014-12-24 MED ORDER — MIRTAZAPINE 15 MG PO TABS
ORAL_TABLET | ORAL | Status: AC
  Administered 2014-12-24: 15 mg via ORAL
  Filled 2014-12-24: qty 1

## 2014-12-24 MED ORDER — OLANZAPINE 10 MG PO TABS
ORAL_TABLET | ORAL | Status: AC
  Administered 2014-12-24: 10 mg via ORAL
  Filled 2014-12-24: qty 1

## 2014-12-24 MED ORDER — OLANZAPINE 10 MG PO TABS
10.0000 mg | ORAL_TABLET | Freq: Every day | ORAL | Status: DC
  Administered 2014-12-24: 10 mg via ORAL

## 2014-12-24 MED ORDER — MIRTAZAPINE 15 MG PO TABS
15.0000 mg | ORAL_TABLET | Freq: Every day | ORAL | Status: DC
  Administered 2014-12-24: 15 mg via ORAL

## 2014-12-24 NOTE — BH Assessment (Signed)
Assessment Note  Nathan Mcintosh is an 59 y.o. male who presents to the ER via law enforcement. According to law enforcement, the pt. Was witnessed at a local [employer] store, rambling and exhibiting odd and bizarre behaviors. He was stating that someone was raping his daughter and that he is died.  According to the pt., "My daughter is going to pick me up. Yea, she right around that corner over there. They rapping her and killing my family but she died..."  According to the pt. Daughter Nathan Mcintosh-[phone]), the pt. Has a history of alcohol use. Due to his use and his behaviors when he is drinking, his family doesn't deal with him that much. He is currently homeless and living in a truck that is park down the street from a family friend.  Pt. Has had multiple hospitalizations, with similar presentations. History of depression with psychosis.  According to records with [employer] ([phone]), the pt. Was receiving CST with [facility]. Last date billed for the service was 08/2014. Hospitalizations has only being with [facility].   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 No pertinent past medical history. Axis IV: economic problems, other psychosocial or environmental problems, problems related to social environment and problems with primary support group  Past Medical History: History reviewed. No pertinent past medical history.  History reviewed. No pertinent past surgical history.  Family History: History reviewed. No pertinent family history.  Social History:  reports that he has been smoking.  He does not have any smokeless tobacco history on file. He reports that he drinks alcohol. He reports that he does not use illicit drugs.  Additional Social History:  Alcohol / Drug Use Pain Medications: None Reported Prescriptions: None Reported Over the Counter: None Reported History of alcohol / drug use?: Yes Longest period  of sobriety (when/how long):  (None Reported) Negative Consequences of Use:  (None Reported) Withdrawal Symptoms:  (None Reported) Substance #1 Name of Substance 1: Alcohol 1 - Frequency: Daily 1 - Duration: Years  CIWA: CIWA-Ar BP: 109/83 mmHg Pulse Rate: 83 COWS:    Allergies: No Known Allergies  Home Medications:  (Not in a hospital admission)  OB/GYN Status:  No LMP for male patient.  General Assessment Data Location of Assessment: [facility] ED TTS Assessment: In system Is this a Tele or Face-to-Face Assessment?: Face-to-Face Is this an Initial Assessment or a Re-assessment for this encounter?: Initial Assessment Marital status: Divorced [place] name: n/a Is patient pregnant?: No Living Arrangements: Other (Comment) (Homeless) Can pt return to current living arrangement?: Yes Admission Status: Involuntary Is patient capable of signing voluntary admission?: No Referral Source: Other Risk manager) Insurance type: Medicaid  Medical Screening Exam ([place]) Medical Exam completed: Yes  Crisis Care Plan Living Arrangements: Other (Comment) (Homeless) Name of Psychiatrist: n/a Name of Therapist: n/a  Education Status Is patient currently in school?: No Current Grade: n/a Highest grade of school patient has completed: Unknown Name of school: n/a Contact person: n/a  Risk to self with the past 6 months Suicidal Ideation: No Has patient been a risk to self within the past 6 months prior to admission? : No Suicidal Intent: No Has patient had any suicidal intent within the past 6 months prior to admission? : No Is patient at risk for suicide?: No Suicidal Plan?: No Has patient had any suicidal plan within the past 6 months prior to admission? : No Access to Means: No What has been your use  of drugs/alcohol within the last 12 months?: History of Alcohl use Previous Attempts/Gestures: No How many times?: 0 Other Self Harm Risks: 0 Triggers for Past  Attempts: None known Intentional Self Injurious Behavior: None Family Suicide History: No Recent stressful life event(s): Other (Comment) (Homeless and strained Relationships) Persecutory voices/beliefs?: No Depression: No Depression Symptoms:  (None Reported) Substance abuse history and/or treatment for substance abuse?: Yes Suicide prevention information given to non-admitted patients: Not applicable  Risk to Others within the past 6 months Homicidal Ideation: No Does patient have any lifetime risk of violence toward others beyond the six months prior to admission? : No Thoughts of Harm to Others: No Current Homicidal Intent: No Current Homicidal Plan: No Access to Homicidal Means: No Identified Victim: None Reported History of harm to others?: No Assessment of Violence: None Noted Violent Behavior Description: None Reported Does patient have access to weapons?: No Criminal Charges Pending?: No Does patient have a court date: No Is patient on probation?: No  Psychosis Hallucinations: Auditory, Visual (Pt. witnessed responded to internal stimuli) Delusions: Persecutory  Mental Status Report Appearance/Hygiene: Bizarre, Disheveled Eye Contact: Poor Motor Activity: Freedom of movement, Unremarkable Speech: Argumentative, Incoherent, Pressured, Loud Level of Consciousness: Alert, Irritable Mood: Anxious, Suspicious, Irritable Affect: Anxious, Depressed, Irritable, Inconsistent with thought content Anxiety Level: Moderate Thought Processes: Irrelevant, Tangential Judgement: Impaired Orientation: Place, Appropriate for developmental age, Person Obsessive Compulsive Thoughts/Behaviors: Severe  Cognitive Functioning Concentration: Decreased Memory: Recent Impaired, Remote Impaired IQ: Average Insight: Poor Impulse Control: Poor Appetite: Poor Weight Loss: 0 Weight Gain: 0 Sleep: Decreased Total Hours of Sleep: 6 (Unknown ) Vegetative Symptoms: None  ADLScreening  [facility] Assessment Services) Patient's cognitive ability adequate to safely complete daily activities?: Yes Patient able to express need for assistance with ADLs?: Yes Independently performs ADLs?: Yes (appropriate for developmental age)  Prior Inpatient Therapy Prior Inpatient Therapy: Yes Prior Therapy Dates: 08/215 (2x) Prior Therapy Facilty/Provider(s): [facility] Reason for Treatment: Depression W/Psychosis  Prior Outpatient Therapy Prior Outpatient Therapy: Yes Prior Therapy Dates: 08/2014 Prior Therapy Facilty/Provider(s): [facility] (Received Product/process development scientist) Reason for Treatment: Depression W/Psychosis Does patient have an ACCT team?: No Does patient have Intensive In-House Services?  : No Does patient have [facility] services? : No Does patient have P4CC services?: No  ADL Screening (condition at time of admission) Patient's cognitive ability adequate to safely complete daily activities?: Yes Patient able to express need for assistance with ADLs?: Yes Independently performs ADLs?: Yes (appropriate for developmental age)       Abuse/Neglect Assessment (Assessment to be complete while patient is alone) Physical Abuse: Denies Verbal Abuse: Denies Sexual Abuse: Denies Exploitation of patient/patient's resources: Denies Self-Neglect: Denies Values / Beliefs Cultural Requests During Hospitalization: None Consults Spiritual Care Consult Needed: No Social Work Consult Needed: No Regulatory affairs officer (For Healthcare) Does patient have an advance directive?: No Would patient like information on creating an advanced directive?: Yes Higher education careers adviser given    Additional Information 1:1 In Past 12 Months?: No CIRT Risk: No Elopement Risk: No Does patient have medical clearance?: Yes  Child/Adolescent Assessment Running Away Risk: Denies (Pt. is an adult)  Disposition:  Disposition Initial Assessment Completed for this Encounter: Yes Disposition of Patient: Inpatient treatment  program Type of inpatient treatment program: Adult  On Site Evaluation by:   Reviewed with Physician:    Durene Cal 12/24/2014 8:45 PM

## 2014-12-24 NOTE — ED Notes (Signed)
ENVIRONMENTAL ASSESSMENT  Potentially harmful objects out of patient reach: Yes.  Personal belongings secured: Yes.  Patient dressed in hospital provided attire only: Yes.  Plastic bags out of patient reach: Yes.  Patient care equipment (cords, cables, call bells, lines, and drains) shortened, removed, or accounted for: Yes.  Equipment and supplies removed from bottom of stretcher: Yes.  Potentially toxic materials out of patient reach: Yes.  Sharps container removed or out of patient reach: Yes.   ED BHU [facility]  Is the patient under IVC or is there intent for IVC: Yes.  Is the patient medically cleared: Yes.  Is there vacancy in the ED BHU: Yes.  Is the population mix appropriate for patient: Yes.  Is the patient awaiting placement in inpatient or outpatient setting: Yes.  Has the patient had a psychiatric consult: Yes.  Survey of unit performed for contraband, proper placement and condition of furniture, tampering with fixtures in bathroom, shower, and each patient room: Yes. ; Findings: All clear  APPEARANCE/BEHAVIOR  calm, cooperative and adequate rapport can be established  NEURO ASSESSMENT  Orientation: time, place and person  Hallucinations: No.None noted (Hallucinations)  Speech: Normal  Gait: normal  RESPIRATORY ASSESSMENT  WNL  CARDIOVASCULAR ASSESSMENT  WNL  GASTROINTESTINAL ASSESSMENT  WNL  EXTREMITIES  WNL  PLAN OF CARE  Provide calm/safe environment. Vital signs assessed twice daily. ED BHU Assessment once each 12-hour shift. Collaborate with intake RN daily or as condition indicates. Assure the ED provider has rounded once each shift. Provide and encourage hygiene. Provide redirection as needed. Assess for escalating behavior; address immediately and inform ED provider.  Assess family dynamic and appropriateness for visitation as needed: Yes. ; If necessary, describe findings:  Educate the patient/family about BHU procedures/visitation: Yes. ; If  necessary, describe findings: Pt is calm and cooperative at this time. Pt understanding and accepting of unit procedures/rules. Will continue to monitor.

## 2014-12-24 NOTE — ED Notes (Signed)
BEHAVIORAL HEALTH ROUNDING Patient sleeping: No. Patient alert and oriented: yes Behavior appropriate: Yes.  ; If no, describe:  Nutrition and fluids offered: Yes  Toileting and hygiene offered: Yes  Sitter present: yes Law enforcement present: Yes  

## 2014-12-24 NOTE — ED Notes (Signed)
Pt. Noted in room. No complaints or concerns voiced. No distress or abnormal behavior noted. Will continue to monitor with security cameras. Q 15 minute rounds continue. 

## 2014-12-24 NOTE — ED Notes (Signed)
Pt escorted by Educational psychologist to [employer].

## 2014-12-24 NOTE — ED Notes (Signed)
IVC/ Plan for admission

## 2014-12-24 NOTE — ED Notes (Signed)
Report received from [facility]. Pt. Alert and oriented in no distress denies pain.  Pt. Instructed to come to me with problems or concerns.Will continue to monitor for safety via security cameras and Q 15 minute checks.
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
## 2014-12-24 NOTE — ED Notes (Signed)
MD at bedside. 

## 2014-12-24 NOTE — ED Notes (Signed)
Sandwich and soft drink given.  
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
## 2014-12-24 NOTE — ED Notes (Signed)
Pt walking about in dayroom
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
## 2014-12-24 NOTE — ED Notes (Signed)

## 2014-12-24 NOTE — ED Notes (Signed)
Pt. Alert and oriented. Denies problems or complaints.

## 2014-12-25 ENCOUNTER — Inpatient Hospital Stay
Admit: 2014-12-25 | Discharge: 2014-12-31 | DRG: 885 | Disposition: A | Discharge status: Home / Self Care | Payer: Medicaid Other | Source: Ambulatory Visit | Attending: Psychiatry | Admitting: Psychiatry

## 2014-12-25 DIAGNOSIS — Z681 Body mass index (BMI) 19 or less, adult: Secondary | ICD-10-CM

## 2014-12-25 DIAGNOSIS — F419 Anxiety disorder, unspecified: Secondary | ICD-10-CM | POA: Diagnosis present

## 2014-12-25 DIAGNOSIS — F17210 Nicotine dependence, cigarettes, uncomplicated: Secondary | ICD-10-CM | POA: Diagnosis present

## 2014-12-25 DIAGNOSIS — E46 Unspecified protein-calorie malnutrition: Secondary | ICD-10-CM | POA: Diagnosis present

## 2014-12-25 DIAGNOSIS — F1010 Alcohol abuse, uncomplicated: Secondary | ICD-10-CM | POA: Diagnosis present

## 2014-12-25 DIAGNOSIS — F333 Major depressive disorder, recurrent, severe with psychotic symptoms: Secondary | ICD-10-CM | POA: Diagnosis not present

## 2014-12-25 MED ORDER — OLANZAPINE 10 MG PO TABS
10.0000 mg | ORAL_TABLET | Freq: Every day | ORAL | Status: DC
  Administered 2014-12-25: 10 mg via ORAL
  Filled 2014-12-25: qty 1

## 2014-12-25 MED ORDER — MAGNESIUM HYDROXIDE 400 MG/5ML PO SUSP
30.0000 mL | Freq: Every day | ORAL | Status: DC | PRN
Start: 2014-12-25 — End: 2014-12-31

## 2014-12-25 MED ORDER — MIRTAZAPINE 30 MG PO TABS
30.0000 mg | ORAL_TABLET | Freq: Every day | ORAL | Status: DC
  Administered 2014-12-25 – 2014-12-29 (×5): 30 mg via ORAL
  Filled 2014-12-25 (×6): qty 1

## 2014-12-25 MED ORDER — ENSURE ENLIVE PO LIQD
237.0000 mL | Freq: Three times a day (TID) | ORAL | Status: DC
  Administered 2014-12-25 – 2014-12-31 (×15): 237 mL via ORAL

## 2014-12-25 MED ORDER — MIRTAZAPINE 15 MG PO TABS: 15.0000 mg | ORAL_TABLET | Freq: Every day | ORAL | Status: DC

## 2014-12-25 MED ORDER — SULFAMETHOXAZOLE-TRIMETHOPRIM 800-160 MG PO TABS
1.0000 | ORAL_TABLET | Freq: Two times a day (BID) | ORAL | Status: DC
  Administered 2014-12-25 – 2014-12-31 (×12): 1 via ORAL
  Filled 2014-12-25 (×16): qty 1

## 2014-12-25 MED ORDER — ACETAMINOPHEN 325 MG PO TABS: 650.0000 mg | ORAL_TABLET | Freq: Four times a day (QID) | ORAL | Status: DC | PRN

## 2014-12-25 MED ORDER — ALUM & MAG HYDROXIDE-SIMETH 200-200-20 MG/5ML PO SUSP
30.0000 mL | ORAL | Status: DC | PRN
Start: 2014-12-25 — End: 2014-12-31

## 2014-12-25 NOTE — Progress Notes (Addendum)
Initial Nutrition Assessment  INTERVENTION:  Meals and Snacks: Cater to patient preferences Medical Food Supplement Therapy: will recommend Ensure Enlive (each supplement provides 350kcal and 20 grams of protein) TID for added nutrition. Spoke with RN Jesusmanuel Stabs, will encourage intake of Ensure at meal times  NUTRITION DIAGNOSIS:  Inadequate oral intake related to acute illness as evidenced by meal completion < 25%.  GOAL:  Patient will meet greater than or equal to 90% of their needs  MONITOR:   (Energy Intake, Anthropometrics)  REASON FOR ASSESSMENT:  Malnutrition Screening Tool    ASSESSMENT:  Pt admitted with psychosis and major depression. Per RN Heywood Stabs, pt very focused on leaving today, packing up clothes and such.  PMHx:  History reviewed. No pertinent past medical history.   Current Nutrition: Per RN Braelon Stabs pt prefers to eat in his room and has only been eating peanut butter and crackers. Per I and O chart, pt ate 0% breakfast tray and per RN no lunch tray either. Recorded po intake of 80% last night at dinner.    Food/Nutrition-Related History: Difficult to determine po intake PTA, per MD note, h/o poor eating habits and EtOH use. Per MST pt reported decreased appetite PTA.   Medications: Remeron  Electrolyte/Renal Profile and Glucose Profile:   Recent Labs Lab 12/24/14 1002  NA 133*  K 4.7  CL 95*  CO2 22  BUN 15  CREATININE 1.13  CALCIUM 9.9  GLUCOSE 103*   Protein Profile:  Recent Labs Lab 12/24/14 1002  ALBUMIN 4.3    Gastrointestinal Profile: No data found. Last BM: unknown   Nutrition-Focused Physical Exam Findings:  Unable to complete Nutrition-Focused physical exam at this time.    Anthropometrics: per MST weight loss of 14-23lbs PTA Height:  Ht Readings from Last 1 Encounters:  12/25/14 5\' 4"  (1.626 m)    Weight:  Wt Readings from Last 1 Encounters:  12/25/14 82 lb (37.195 kg)    Ideal Body Weight:   59kg  Wt Readings from  Last 10 Encounters:  12/25/14 82 lb (37.195 kg)  12/24/14 90 lb (40.824 kg)    BMI:  Body mass index is 14.07 kg/(m^2).  Estimated Nutritional Needs:  Kcal:  1890-2233kcals, BEE: 1321kcals, TEE: (IF 1.1-1.3)(AF 1.3) using IBW of 59kg  Protein:  59-71g protein (1.0-1.2g/kg) using IBW of 59kg  Fluid:  1475-1729mL of fluid (25-51mL/kg) using IBW of 59kg  Skin:  Reviewed, no issues  Diet Order:  Diet regular Room service appropriate?: Yes; Fluid consistency:: Thin  EDUCATION NEEDS:  Education needs no appropriate at this time   Intake/Output Summary (Last 24 hours) at 12/25/14 1256 Last data filed at 12/25/14 0900  Gross per 24 hour  Intake      0 ml  Output      0 ml  Net      0 ml    HIGH Care Level  Dwyane Luo, RD, LDN Pager [phone]

## 2014-12-25 NOTE — Progress Notes (Signed)
Pt noted to agitated this AM. Pt focused on going home . Pt contently stating " I am ready to go home. I need to go home now. Get my clothes.? Pt has been seclusive to his all morning but did come out to the dayroom this evening to watch TV. Pt denies SI and A/V hallucination.

## 2014-12-25 NOTE — [facility] Group Notes (Signed)
[facility] LCSW Group Therapy  12/25/2014 2:39 PM  Type of Therapy:  Group Therapy  Participation Level:  Minimal  Participation Quality:    Affect:    Cognitive:    Insight:    Engagement in Therapy:    Modes of Intervention:    Summary of Progress/Problems: patient did come out for a short while to group today  Pauletta Browns, [place] 12/25/2014, 2:39 PM

## 2014-12-25 NOTE — Tx Team (Signed)
Initial Interdisciplinary Treatment Plan   PATIENT STRESSORS: Loss of housing Substance abuse   PATIENT STRENGTHS: Motivation for treatment/growth   PROBLEM LIST: Problem List/Patient Goals Date to be addressed Date deferred Reason deferred Estimated date of resolution  Homelessness 12/24/14     Substance abuse. 12/1714                                                DISCHARGE CRITERIA:  Adequate post-discharge living arrangements Improved stabilization in mood, thinking, and/or behavior Safe-care adequate arrangements made  PRELIMINARY DISCHARGE PLAN: Attend 12-step recovery group Outpatient therapy Placement in alternative living arrangements  PATIENT/FAMIILY INVOLVEMENT: This treatment plan has been presented to and reviewed with the patient, Kelcy, Baeten patient and family have been given the opportunity to ask questions and make suggestions.  Demonta Wombles Abisola Modesty Rudy 12/25/2014, 4:26 AM

## 2014-12-25 NOTE — Progress Notes (Signed)
D: Pt denies SI/HI/AVH. Pt is pleasant and cooperative at times. Pt is in denial about hearing voices. Pt repeatedly stated he doesn't hear voices and he was never hallucinating "I don't hallucinate".   A: Pt was offered support and encouragement. Pt was given scheduled medications. Pt was encourage to attend groups. Q 15 minute checks were done for safety.   R:Pt  interacts well  peers and staff. Pt is taking medication. Pt has no complaints at this time Pt receptive to treatment and safety maintained on unit.

## 2014-12-25 NOTE — [facility] Suicide Risk Assessment (Signed)
[facility] Admission Suicide Risk Assessment   Nursing information obtained from:  Patient Demographic factors:  Male, Low socioeconomic status, Living alone Current Mental Status:  NA Loss Factors:  Financial problems / change in socioeconomic status Historical Factors:  Family history of mental illness or substance abuse Risk Reduction Factors:  NA Total Time spent with patient: 30 minutes Principal Problem: <principal problem not specified> Diagnosis:   Patient Active Problem List   Diagnosis Date Noted  . 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 Severe major depression with psychotic features [F32.3] 12/24/2014  . Alcohol abuse [F10.10] 12/24/2014  . Malnutrition [E46] 12/24/2014     Continued Clinical Symptoms:  Alcohol Use Disorder Identification Test Final Score (AUDIT): 18 The "Alcohol Use Disorders Identification Test", Guidelines for Use in Primary Care, Second Edition.  World Pharmacologist [facility]). Score between 0-7:  no or low risk or alcohol related problems. Score between 8-15:  moderate risk of alcohol related problems. Score between 16-19:  high risk of alcohol related problems. Score 20 or above:  warrants further diagnostic evaluation for alcohol dependence and treatment.   CLINICAL FACTORS:   Depression:   Delusional Impulsivity Alcohol/Substance Abuse/Dependencies More than one psychiatric diagnosis Currently Psychotic Unstable or Poor Therapeutic Relationship Previous Psychiatric Diagnoses and Treatments   Musculoskeletal: Strength & Muscle Tone: within normal limits Gait & Station: normal Patient leans: N/A  Psychiatric Specialty Exam: Physical Exam  Constitutional: He appears well-nourished. He appears cachectic.  HENT:  Head: Normocephalic and atraumatic.  Eyes: Conjunctivae are normal. Pupils are equal, round, and reactive to light.  Neck: Normal range of motion.  Cardiovascular: Normal heart sounds.    Respiratory: Effort normal.  GI: Soft.  Musculoskeletal: Normal range of motion.  Neurological: He is alert.  Skin: Skin is warm and dry.  Psychiatric: His mood appears anxious. His affect is angry. His speech is tangential. He is agitated and actively hallucinating. Thought content is paranoid and delusional. Cognition and memory are impaired. He expresses impulsivity.    Review of Systems  Constitutional: Negative.   HENT: Negative.   Eyes: Negative.   Respiratory: Negative.   Cardiovascular: Negative.   Gastrointestinal: Negative.   Musculoskeletal: Negative.   Skin: Negative.   Neurological: Negative.   Psychiatric/Behavioral: Positive for hallucinations. Negative for depression, suicidal ideas and substance abuse. The patient is nervous/anxious and has insomnia.     Blood pressure 136/96, pulse 120, temperature 97.9 F (36.6 C), temperature source Oral, resp. rate 20, height 5\' 4"  (1.626 m), weight 37.195 kg (82 lb), SpO2 98 %.Body mass index is 14.07 kg/(m^2).  General Appearance: Disheveled  Eye Contact::  Minimal  Speech:  Garbled  Volume:  Normal  Mood:  Angry and Anxious  Affect:  Congruent  Thought Process:  Disorganized  Orientation:  Full (Time, Place, and Person)  Thought Content:  Delusions, Hallucinations: Auditory and Paranoid Ideation  Suicidal Thoughts:  No  Homicidal Thoughts:  No  Memory:  Immediate;   Poor Recent;   Poor Remote;   Poor  Judgement:  Impaired  Insight:  Lacking  Psychomotor Activity:  Normal  Concentration:  Poor  Recall:  Poor  Fund of Knowledge:Fair  Language: Fair  Akathisia:  No  Handed:  Right  AIMS (if indicated):     Assets:  Housing  Sleep:  Number of Hours: 4.5  Cognition: WNL  ADL's:  Intact     COGNITIVE FEATURES THAT CONTRIBUTE TO RISK:  Closed-mindedness and Polarized thinking    SUICIDE  RISK:   Mild:  Suicidal ideation of limited frequency, intensity, duration, and specificity.  There are no identifiable  plans, no associated intent, mild dysphoria and related symptoms, good self-control (both objective and subjective assessment), few other risk factors, and identifiable protective factors, including available and accessible social support.  PLAN OF CARE: Patient has a diagnosis of severe recurrent depression with psychotic features but actually appears more angry and irritable right now. More obviously psychotic. Has not made any suicidal statements at all or acted to harm himself. Patient is on 15 minute checks. Started on medicine for psychotic depression. Daily management and observation.  Medical Decision Making:  New problem, with additional work up planned, Review of Psycho-Social Stressors (1), Decision to obtain old records (1), Review and summation of old records (2) and Review of Medication Regimen & Side Effects (2)  I certify that inpatient services furnished can reasonably be expected to improve the patient's condition.   Yocelin Vanlue 12/25/2014, 2:38 PM

## 2014-12-25 NOTE — H&P (Signed)
Psychiatric Admission Assessment Adult  Patient Identification: Nathan Mcintosh MRN:  [id number] Date of Evaluation:  12/25/2014 Chief Complaint:  major depression Principal Diagnosis: <principal problem not specified> Diagnosis:   Patient Active Problem List   Diagnosis Date Noted  . 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 Severe major depression with psychotic features [F32.3] 12/24/2014  . Alcohol abuse [F10.10] 12/24/2014  . Malnutrition [E46] 12/24/2014   History of Present Illness:: This is an intake for this 59 year old male with a history of severe psychotic depression and alcohol abuse. Patient came to the emergency room under involuntary commitment yesterday. He was bizarre delusional and agitated. He refused to cooperate with the interview with me. Today on interview he continues to be delusional and clearly hallucinating. He is talking to the air and asking questions of it. Incoherent. He refuses to discuss recent substance abuse. Insight is very poor. Unable to make any kind of coherent plan. He is refusing regular meals although he has eaten some snacks. His vital signs are fairly stable although he does have an elevated pulse that might be related to agitation. Possible this could be DTs although with his history of psychosis that seems less likely. Elements:  Quality:  Psychotic bizarre thinking and behavior. Severity:  Potentially life threatening from malnutrition. Timing:  Unclear given his lack of history. Duration:  Going on a few days. Context:  Unknown social situation. Associated Signs/Symptoms: Depression Symptoms:  psychomotor agitation, difficulty concentrating, hopelessness, recurrent thoughts of death, anxiety, disturbed sleep, weight loss, (Hypo) Manic Symptoms:  Delusions, Distractibility, Irritable Mood, Anxiety Symptoms:  Excessive Worry, Psychotic Symptoms:  Delusions, Hallucinations: Auditory PTSD  Symptoms: Negative Total Time spent with patient: 30 minutes  Past Medical History: History reviewed. No pertinent past medical history. History reviewed. No pertinent past surgical history. Family History: History reviewed. No pertinent family history. Social History:  History  Alcohol Use  . 2.4 - 3.0 oz/week  . 4-5 Cans of beer per week     History  Drug Use No    History   Social History  . Marital Status: Single    Spouse Name: N/A  . Number of Children: N/A  . Years of Education: N/A   Social History Main Topics  . Smoking status: Current Some Day Smoker -- 1.00 packs/day for 30 years    Last Attempt to Quit: 12/25/2014  . Smokeless tobacco: Not on file  . Alcohol Use: 2.4 - 3.0 oz/week    4-5 Cans of beer per week  . Drug Use: No  . Sexual Activity: Not Currently   Other Topics Concern  . None   Social History Narrative   Additional Social History:    Pain Medications: None Reported Prescriptions: None Reported Over the Counter: None Reported History of alcohol / drug use?: No history of alcohol / drug abuse Longest period of sobriety (when/how long):  (unknown. ) Negative Consequences of Use: Financial, Personal relationships Withdrawal Symptoms: Other (Comment) (None. ) Name of Substance 1: Alcohol 1 - Frequency: Daily 1 - Duration: Years                   Musculoskeletal: Strength & Muscle Tone: within normal limits Gait & Station: normal Patient leans: N/A  Psychiatric Specialty Exam: Physical Exam  Constitutional: He appears cachectic.  HENT:  Head: Normocephalic and atraumatic.  Eyes: Conjunctivae are normal. Pupils are equal, round, and reactive to light.  Neck: Normal range of motion.  Cardiovascular: Normal heart sounds.  Respiratory: Effort normal.  GI: Soft.  Musculoskeletal: Normal range of motion.  Neurological: He is alert.  Skin: Skin is warm and dry.  Psychiatric: His mood appears anxious. His affect is angry. His  speech is rapid and/or pressured. He is agitated and actively hallucinating. Thought content is paranoid and delusional. Cognition and memory are impaired. He expresses impulsivity.    Review of Systems  Constitutional: Negative.   HENT: Negative.   Eyes: Negative.   Respiratory: Negative.   Cardiovascular: Negative.   Gastrointestinal: Negative.   Musculoskeletal: Negative.   Skin: Negative.   Neurological: Negative.   Psychiatric/Behavioral: Negative for depression, suicidal ideas, hallucinations, memory loss and substance abuse. The patient is not nervous/anxious and does not have insomnia.     Blood pressure 136/96, pulse 120, temperature 97.9 F (36.6 C), temperature source Oral, resp. rate 20, height _0  (1.626 m), weight 37.195 kg (82 lb), SpO2 98 %.Body mass index is 14.07 kg/(m^2).  General Appearance: Disheveled  Eye Contact::  Minimal  Speech:  Garbled  Volume:  Normal  Mood:  Angry  Affect:  Congruent  Thought Process:  Disorganized  Orientation:  Full (Time, Place, and Person)  Thought Content:  Delusions and Paranoid Ideation  Suicidal Thoughts:  No  Homicidal Thoughts:  No  Memory:  Immediate;   Poor Recent;   Poor Remote;   Poor  Judgement:  Impaired  Insight:  Lacking  Psychomotor Activity:  Restlessness  Concentration:  Poor  Recall:  Poor  Fund of Knowledge:Poor  Language: Fair  Akathisia:  No  Handed:  Right  AIMS (if indicated):     Assets:  Housing Social Support  ADL's:  Intact  Cognition: WNL  Sleep:  Number of Hours: 4.5   Risk to Self: Is patient at risk for suicide?: No Risk to Others:   Prior Inpatient Therapy:   Prior Outpatient Therapy:    Alcohol Screening: 1. How often do you have a drink containing alcohol?: 2 to 3 times a week 2. How many drinks containing alcohol do you have on a typical day when you are drinking?: 3 or 4 3. How often do you have six or more drinks on one occasion?: Monthly Preliminary Score: 3 4. How often  during the last year have you found that you were not able to stop drinking once you had started?: Monthly 5. How often during the last year have you failed to do what was normally expected from you becasue of drinking?: Monthly 6. How often during the last year have you needed a first drink in the morning to get yourself going after a heavy drinking session?: Monthly 7. How often during the last year have you had a feeling of guilt of remorse after drinking?: Monthly 8. How often during the last year have you been unable to remember what happened the night before because you had been drinking?: Monthly 9. Have you or someone else been injured as a result of your drinking?: No 10. Has a relative or friend or a doctor or another health worker been concerned about your drinking or suggested you cut down?: Yes, but not in the last year Alcohol Use Disorder Identification Test Final Score (AUDIT): 18 Brief Intervention: Yes  Allergies:  No Known Allergies Lab Results:  Results for orders placed or performed during the hospital encounter of 12/24/14 (from the past 48 hour(s))  CBC     Status: Abnormal   Collection Time: 12/24/14 10:02 AM  Result Value Ref Range  WBC 7.8 3.8 - 10.6 K/uL   RBC 5.73 4.40 - 5.90 MIL/uL   Hemoglobin 16.5 13.0 - 18.0 g/dL   HCT 50.6 40.0 - 52.0 %   MCV 88.4 80.0 - 100.0 fL   MCH 28.8 26.0 - 34.0 pg   MCHC 32.5 32.0 - 36.0 g/dL   RDW 16.2 (H) 11.5 - 14.5 %   Platelets 196 150 - 440 K/uL  Comprehensive metabolic panel     Status: Abnormal   Collection Time: 12/24/14 10:02 AM  Result Value Ref Range   Sodium 133 (L) 135 - 145 mmol/L   Potassium 4.7 3.5 - 5.1 mmol/L   Chloride 95 (L) 101 - 111 mmol/L   CO2 22 22 - 32 mmol/L   Glucose, Bld 103 (H) 65 - 99 mg/dL   BUN 15 6 - 20 mg/dL   Creatinine, Ser 1.13 0.61 - 1.24 mg/dL   Calcium 9.9 8.9 - 10.3 mg/dL   Total Protein 8.4 (H) 6.5 - 8.1 g/dL   Albumin 4.3 3.5 - 5.0 g/dL   AST 52 (H) 15 - 41 U/L   ALT 50 17 -  63 U/L   Alkaline Phosphatase 84 38 - 126 U/L   Total Bilirubin 0.9 0.3 - 1.2 mg/dL   GFR calc non Af Amer >60 >60 mL/min   GFR calc Af Amer >60 >60 mL/min    Comment: (NOTE) The eGFR has been calculated using the CKD EPI equation. This calculation has not been validated in all clinical situations. eGFR's persistently <60 mL/min signify possible Chronic Kidney Disease.    Anion gap 16 (H) 5 - 15  Ethanol (ETOH)     Status: None   Collection Time: 12/24/14 10:02 AM  Result Value Ref Range   Alcohol, Ethyl (B) <5 <5 mg/dL    Comment:        LOWEST DETECTABLE LIMIT FOR SERUM ALCOHOL IS 5 mg/dL FOR MEDICAL PURPOSES ONLY   Urinalysis complete, with microscopic ([facility] only)     Status: Abnormal   Collection Time: 12/24/14  4:55 PM  Result Value Ref Range   Color, Urine AMBER (A) YELLOW   APPearance HAZY (A) CLEAR   Glucose, UA NEGATIVE NEGATIVE mg/dL   Bilirubin Urine NEGATIVE NEGATIVE   Ketones, ur 1+ (A) NEGATIVE mg/dL   Specific Gravity, Urine 1.025 1.005 - 1.030   Hgb urine dipstick 1+ (A) NEGATIVE   pH 5.0 5.0 - 8.0   Protein, ur 100 (A) NEGATIVE mg/dL   Nitrite NEGATIVE NEGATIVE   Leukocytes, UA 2+ (A) NEGATIVE   RBC / HPF 6-30 0 - 5 RBC/hpf   WBC, UA TOO NUMEROUS TO COUNT 0 - 5 WBC/hpf   Bacteria, UA RARE (A) NONE SEEN   Squamous Epithelial / LPF 0-5 (A) NONE SEEN   WBC Clumps PRESENT    Mucous PRESENT    Hyaline Casts, UA PRESENT   Urine Drug Screen, Qualitative ([facility] only)     Status: None   Collection Time: 12/24/14  4:55 PM  Result Value Ref Range   Tricyclic, Ur Screen NONE DETECTED NONE DETECTED   Amphetamines, Ur Screen NONE DETECTED NONE DETECTED   MDMA (Ecstasy)Ur Screen NONE DETECTED NONE DETECTED   Cocaine Metabolite,Ur [place] NONE DETECTED NONE DETECTED   Opiate, Ur Screen NONE DETECTED NONE DETECTED   Phencyclidine (PCP) Ur S NONE DETECTED NONE DETECTED   Cannabinoid 50 Ng, Ur [place] NONE DETECTED NONE DETECTED   Barbiturates, Ur Screen NONE DETECTED NONE  DETECTED   Benzodiazepine,  Ur Scrn NONE DETECTED NONE DETECTED   Methadone Scn, Ur NONE DETECTED NONE DETECTED    Comment: (NOTE) 102  Tricyclics, urine               Cutoff 1000 ng/mL 200  Amphetamines, urine             Cutoff 1000 ng/mL 300  MDMA (Ecstasy), urine           Cutoff 500 ng/mL 400  Cocaine Metabolite, urine       Cutoff 300 ng/mL 500  Opiate, urine                   Cutoff 300 ng/mL 600  Phencyclidine (PCP), urine      Cutoff 25 ng/mL 700  Cannabinoid, urine              Cutoff 50 ng/mL 800  Barbiturates, urine             Cutoff 200 ng/mL 900  Benzodiazepine, urine           Cutoff 200 ng/mL 1000 Methadone, urine                Cutoff 300 ng/mL 1100 1200 The urine drug screen provides only a preliminary, unconfirmed 1300 analytical test result and should not be used for non-medical 1400 purposes. Clinical consideration and professional judgment should 1500 be applied to any positive drug screen result due to possible 1600 interfering substances. A more specific alternate chemical method 1700 must be used in order to obtain a confirmed analytical result.  1800 Gas chromato graphy / mass spectrometry (GC/MS) is the preferred 1900 confirmatory method.    Current Medications: Current Facility-Administered Medications  Medication Dose Route Frequency Provider Last Rate Last Dose  . acetaminophen (TYLENOL) tablet 650 mg  650 mg Oral Q6H PRN Gonzella Lex, MD      . alum & mag hydroxide-simeth (MAALOX/MYLANTA) 200-200-20 MG/5ML suspension 30 mL  30 mL Oral Q4H PRN Gonzella Lex, MD      . feeding supplement (ENSURE ENLIVE) (ENSURE ENLIVE) liquid 237 mL  237 mL Oral TID WC Marjie Skiff, MD      . magnesium hydroxide (MILK OF MAGNESIA) suspension 30 mL  30 mL Oral Daily PRN Gonzella Lex, MD      . mirtazapine (REMERON) tablet 30 mg  30 mg Oral QHS Gonzella Lex, MD      . OLANZapine (ZYPREXA) tablet 10 mg  10 mg Oral QHS Gonzella Lex, MD      .  sulfamethoxazole-trimethoprim (BACTRIM DS,SEPTRA DS) 800-160 MG per tablet 1 tablet  1 tablet Oral Q12H Gonzella Lex, MD       PTA Medications: No prescriptions prior to admission    Previous Psychotropic Medications: Yes   Substance Abuse History in the last 12 months:  Yes.      Consequences of Substance Abuse: Medical Consequences:  Unclear. The patient will not give a more complete history. He does not appear to have a definite history of DTs in the past.  Results for orders placed or performed during the hospital encounter of 12/24/14 (from the past 72 hour(s))  CBC     Status: Abnormal   Collection Time: 12/24/14 10:02 AM  Result Value Ref Range   WBC 7.8 3.8 - 10.6 K/uL   RBC 5.73 4.40 - 5.90 MIL/uL   Hemoglobin 16.5 13.0 - 18.0 g/dL   HCT 50.6 40.0 - 52.0 %  MCV 88.4 80.0 - 100.0 fL   MCH 28.8 26.0 - 34.0 pg   MCHC 32.5 32.0 - 36.0 g/dL   RDW 16.2 (H) 11.5 - 14.5 %   Platelets 196 150 - 440 K/uL  Comprehensive metabolic panel     Status: Abnormal   Collection Time: 12/24/14 10:02 AM  Result Value Ref Range   Sodium 133 (L) 135 - 145 mmol/L   Potassium 4.7 3.5 - 5.1 mmol/L   Chloride 95 (L) 101 - 111 mmol/L   CO2 22 22 - 32 mmol/L   Glucose, Bld 103 (H) 65 - 99 mg/dL   BUN 15 6 - 20 mg/dL   Creatinine, Ser 1.13 0.61 - 1.24 mg/dL   Calcium 9.9 8.9 - 10.3 mg/dL   Total Protein 8.4 (H) 6.5 - 8.1 g/dL   Albumin 4.3 3.5 - 5.0 g/dL   AST 52 (H) 15 - 41 U/L   ALT 50 17 - 63 U/L   Alkaline Phosphatase 84 38 - 126 U/L   Total Bilirubin 0.9 0.3 - 1.2 mg/dL   GFR calc non Af Amer >60 >60 mL/min   GFR calc Af Amer >60 >60 mL/min    Comment: (NOTE) The eGFR has been calculated using the CKD EPI equation. This calculation has not been validated in all clinical situations. eGFR's persistently <60 mL/min signify possible Chronic Kidney Disease.    Anion gap 16 (H) 5 - 15  Ethanol (ETOH)     Status: None   Collection Time: 12/24/14 10:02 AM  Result Value Ref Range    Alcohol, Ethyl (B) <5 <5 mg/dL    Comment:        LOWEST DETECTABLE LIMIT FOR SERUM ALCOHOL IS 5 mg/dL FOR MEDICAL PURPOSES ONLY   Urinalysis complete, with microscopic ([facility] only)     Status: Abnormal   Collection Time: 12/24/14  4:55 PM  Result Value Ref Range   Color, Urine AMBER (A) YELLOW   APPearance HAZY (A) CLEAR   Glucose, UA NEGATIVE NEGATIVE mg/dL   Bilirubin Urine NEGATIVE NEGATIVE   Ketones, ur 1+ (A) NEGATIVE mg/dL   Specific Gravity, Urine 1.025 1.005 - 1.030   Hgb urine dipstick 1+ (A) NEGATIVE   pH 5.0 5.0 - 8.0   Protein, ur 100 (A) NEGATIVE mg/dL   Nitrite NEGATIVE NEGATIVE   Leukocytes, UA 2+ (A) NEGATIVE   RBC / HPF 6-30 0 - 5 RBC/hpf   WBC, UA TOO NUMEROUS TO COUNT 0 - 5 WBC/hpf   Bacteria, UA RARE (A) NONE SEEN   Squamous Epithelial / LPF 0-5 (A) NONE SEEN   WBC Clumps PRESENT    Mucous PRESENT    Hyaline Casts, UA PRESENT   Urine Drug Screen, Qualitative ([facility] only)     Status: None   Collection Time: 12/24/14  4:55 PM  Result Value Ref Range   Tricyclic, Ur Screen NONE DETECTED NONE DETECTED   Amphetamines, Ur Screen NONE DETECTED NONE DETECTED   MDMA (Ecstasy)Ur Screen NONE DETECTED NONE DETECTED   Cocaine Metabolite,Ur [place] NONE DETECTED NONE DETECTED   Opiate, Ur Screen NONE DETECTED NONE DETECTED   Phencyclidine (PCP) Ur S NONE DETECTED NONE DETECTED   Cannabinoid 50 Ng, Ur [place] NONE DETECTED NONE DETECTED   Barbiturates, Ur Screen NONE DETECTED NONE DETECTED   Benzodiazepine, Ur Scrn NONE DETECTED NONE DETECTED   Methadone Scn, Ur NONE DETECTED NONE DETECTED    Comment: (NOTE) 482  Tricyclics, urine  Cutoff 1000 ng/mL 200  Amphetamines, urine             Cutoff 1000 ng/mL 300  MDMA (Ecstasy), urine           Cutoff 500 ng/mL 400  Cocaine Metabolite, urine       Cutoff 300 ng/mL 500  Opiate, urine                   Cutoff 300 ng/mL 600  Phencyclidine (PCP), urine      Cutoff 25 ng/mL 700  Cannabinoid, urine              Cutoff 50  ng/mL 800  Barbiturates, urine             Cutoff 200 ng/mL 900  Benzodiazepine, urine           Cutoff 200 ng/mL 1000 Methadone, urine                Cutoff 300 ng/mL 1100 1200 The urine drug screen provides only a preliminary, unconfirmed 1300 analytical test result and should not be used for non-medical 1400 purposes. Clinical consideration and professional judgment should 1500 be applied to any positive drug screen result due to possible 1600 interfering substances. A more specific alternate chemical method 1700 must be used in order to obtain a confirmed analytical result.  1800 Gas chromato graphy / mass spectrometry (GC/MS) is the preferred 1900 confirmatory method.     Observation Level/Precautions:  15 minute checks  Laboratory:  CBC Chemistry Profile HbAIC HCG UDS UA  Psychotherapy:  Daily individual psychotherapy as well as group participation   Medications:  Currently on olanzapine and mirtazapine. Reevaluate if it appears he is getting more delirious   Consultations:  None at this time   Discharge Concerns:  Ability to live by himself   Estimated LOS: 2-5   Other:     Psychological Evaluations: No   Treatment Plan Summary: Daily contact with patient to assess and evaluate symptoms and progress in treatment, Medication management and Plan Patient will continue to be monitored and be on 15 minute checks. Monitor vital signs. Daily interview to try and build some kind of rapport and get more information. Continue current treatment with mirtazapine and olanzapine. Based on his urine analysis being dirty I am also putting him on Septra at this time. I will go ahead and get an RPR as well just in case.  Medical Decision Making:  New problem, with additional work up planned, Review or order clinical lab tests (1), Review of Medication Regimen & Side Effects (2) and Review of New Medication or Change in Dosage (2)  I certify that inpatient services furnished can reasonably  be expected to improve the patient's condition.   John Clapacs 6/18/20162:42 PM

## 2014-12-25 NOTE — [facility] Counselor (Signed)
Authorization Request for Psych Inpt. Treatment from [place] completed and submitted. [id number].

## 2014-12-25 NOTE — Plan of Care (Signed)
Problem: Alteration in thought process Goal: LTG-Patient behavior demonstrates decreased signs psychosis (Patient behavior demonstrates decreased signs of psychosis to the point the patient is safe to return home and continue treatment in an outpatient setting.) Outcome: Not Progressing Patient endorses paranoia, and demonstrate  Some delusion.

## 2014-12-25 NOTE — Progress Notes (Signed)
Patient is a 59 year old old AA male patient of Dr Jimmye Norman, admitted for severe major depression with psychotic features. Patient was admitted because he was demonstrating paranoia, bizarre behavior and delusions. Patient has a history of alcohol abuse and psychotic depression. Upon arrival to the unit patient is alert to person and time with periods of confusion to situation and place, affect is flat and sad,he appeared, restless, with disorganized thoughts. During admission patient was paranoid and he believed someone was going to hurt him, he was reassure he was in a safe environment. Patient's skin was  Assessed; appears dry and intact, looks  malnourished,  Skin searched done no contraband found, oriented to the unit, 15 minutes checks maintained will continue to closely monitor.

## 2014-12-25 NOTE — Plan of Care (Signed)
Problem: Ineffective individual coping Goal: STG: Patient will remain free from self harm Outcome: Progressing Pt safe on the unit  Problem: Alteration in thought process Goal: LTG-Patient behavior demonstrates decreased signs psychosis (Patient behavior demonstrates decreased signs of psychosis to the point the patient is safe to return home and continue treatment in an outpatient setting.)  Outcome: Not Progressing Even though pt states he does not have AVH, pt appears to be responding to internal stimuli when observed sitting in the dayroom.   Problem: Alteration in mood & ability to function due to Goal: LTG-Pt reports reduction in suicidal thoughts (Patient reports reduction in suicidal thoughts and is able to verbalize a safety plan for whenever patient is feeling suicidal)  Outcome: Progressing Pt denied SI at this time

## 2014-12-25 NOTE — Progress Notes (Signed)
LCSW attempted to do patient assessment and was advised by charge nurse patient to acute to participate in his assessment. LCSW will assess again on Sunday June 19th,2016

## 2014-12-26 LAB — URINALYSIS COMPLETE WITH MICROSCOPIC (ARMC ONLY)
Bilirubin Urine: NEGATIVE
Glucose, UA: NEGATIVE mg/dL
NITRITE: NEGATIVE
Protein, ur: NEGATIVE mg/dL
Specific Gravity, Urine: 1.023 (ref 1.005–1.030)
Trans Epithel, UA: 3
pH: 5 (ref 5.0–8.0)

## 2014-12-26 LAB — URINE DRUG SCREEN, QUALITATIVE (ARMC ONLY)
Amphetamines, Ur Screen: NOT DETECTED
BARBITURATES, UR SCREEN: NOT DETECTED
Benzodiazepine, Ur Scrn: NOT DETECTED
CANNABINOID 50 NG, UR ~~LOC~~: NOT DETECTED
Cocaine Metabolite,Ur ~~LOC~~: NOT DETECTED
MDMA (ECSTASY) UR SCREEN: NOT DETECTED
METHADONE SCREEN, URINE: NOT DETECTED
OPIATE, UR SCREEN: NOT DETECTED
PHENCYCLIDINE (PCP) UR S: NOT DETECTED
Tricyclic, Ur Screen: NOT DETECTED

## 2014-12-26 LAB — RPR: RPR Ser Ql: NONREACTIVE

## 2014-12-26 LAB — HIV ANTIBODY (ROUTINE TESTING W REFLEX): HIV Screen 4th Generation wRfx: NONREACTIVE

## 2014-12-26 MED ORDER — OLANZAPINE 5 MG PO TABS
15.0000 mg | ORAL_TABLET | Freq: Every day | ORAL | Status: DC
  Administered 2014-12-26 – 2014-12-27 (×2): 15 mg via ORAL
  Filled 2014-12-26 (×2): qty 1
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
## 2014-12-26 NOTE — [facility] Group Notes (Signed)
[facility] Notes:  (Nursing/MHT/Case Management/Adjunct)  Date:  12/26/2014  Time:  9:52 AM  Type of Therapy:  Goals  Participation Level:  Did Not Attend   [name] 12/26/2014, 9:52 AM

## 2014-12-26 NOTE — Progress Notes (Signed)
Pt has been less agitated this period. Pt has been out of his room more this period. Pt has been less focused on going home. Pt's mood and affect continues to be depressed. Pt denies SI and A/V hallucinations.
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
## 2014-12-27 DIAGNOSIS — F333 Major depressive disorder, recurrent, severe with psychotic symptoms: Principal | ICD-10-CM

## 2014-12-27 MED ORDER — TRAZODONE HCL 50 MG PO TABS
50.0000 mg | ORAL_TABLET | Freq: Every evening | ORAL | Status: DC | PRN
  Filled 2014-12-27: qty 1

## 2014-12-27 MED ORDER — LORAZEPAM 1 MG PO TABS: 1.0000 mg | ORAL_TABLET | Freq: Four times a day (QID) | ORAL | Status: DC | PRN

## 2014-12-27 NOTE — Progress Notes (Signed)
[facility] MD Progress Note  12/27/2014 2:45 PM Nathan Mcintosh  MRN:  [id number] Subjective:  Follow-up for this 59 year old man who is currently psychotic. Patient articulates that he was brought here by police because they thought he was "crazy." He discuss what appears to be delusional material that he has discussed with other providers during this admission. He states that his daughter was kidnapped by somebody. He discusses that this is related to him being a "kingpin." When I asked him to discuss the nature being a king Nathan Mcintosh stated he could not tell me that. He indicates that his housing situation is that he's lived in a truck for more than several months. He states that he had his daughter were about to move him into a apartment. When I asked him if his daughter was in this area he said no. He denied excessive alcohol use stating he might have one or 2 beers a day stating that they would be 40 ounces. He denied auditory hallucinations, visual hallucinations, suicidal ideation or homicidal ideation. Per review of the chart it appears he said some psychiatric following at [facility]. Principal Problem: Severe recurrent major depression with psychotic features Diagnosis:   Patient Active Problem List   Diagnosis Date Noted  . 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 Severe major depression with psychotic features [F32.3] 12/24/2014  . Alcohol abuse [F10.10] 12/24/2014  . Malnutrition [E46] 12/24/2014   Total Time spent with patient: 30 minutes   Past Medical History: History reviewed. No pertinent past medical history. History reviewed. No pertinent past surgical history. Family History: History reviewed. No pertinent family history. Social History:  History  Alcohol Use  . 2.4 - 3.0 oz/week  . 4-5 Cans of beer per week     History  Drug Use No    History   Social History  . Marital Status: Single    Spouse Name: N/A  . Number of Children: N/A  .  Years of Education: N/A   Social History Main Topics  . Smoking status: Current Some Day Smoker -- 1.00 packs/day for 30 years    Last Attempt to Quit: 12/25/2014  . Smokeless tobacco: Not on file  . Alcohol Use: 2.4 - 3.0 oz/week    4-5 Cans of beer per week  . Drug Use: No  . Sexual Activity: Not Currently   Other Topics Concern  . None   Social History Narrative   Additional History:    Sleep: Fair  Appetite:  Fair   Assessment:   Musculoskeletal: Strength & Muscle Tone: within normal limits Gait & Station: normal Patient leans: N/A   Psychiatric Specialty Exam: Physical Exam  Constitutional: He appears well-developed. He appears cachectic. He has a sickly appearance.  HENT:  Head: Normocephalic and atraumatic.  Eyes: Conjunctivae are normal. Pupils are equal, round, and reactive to light.  Neck: Normal range of motion.  Cardiovascular: Normal heart sounds.   Respiratory: Effort normal.  GI: Soft.  Musculoskeletal: Normal range of motion.  Neurological: He is alert.  Skin: Skin is warm and dry.  Psychiatric: His speech is normal. His affect is blunt and labile. He is withdrawn and actively hallucinating. Thought content is delusional. Cognition and memory are impaired. He expresses inappropriate judgment.    Review of Systems  Constitutional: Negative.   HENT: Negative.   Eyes: Negative.   Respiratory: Negative.   Cardiovascular: Negative.   Gastrointestinal: Negative.   Musculoskeletal: Negative.   Skin: Negative.   Neurological:  Negative.   Psychiatric/Behavioral: Positive for hallucinations. Negative for depression, suicidal ideas, memory loss and substance abuse. The patient is nervous/anxious. The patient does not have insomnia.     Blood pressure 126/93, pulse 121, temperature 97.5 F (36.4 C), temperature source Oral, resp. rate 20, height 5\' 4"  (1.626 m), weight 37.195 kg (82 lb), SpO2 98 %.Body mass index is 14.07 kg/(m^2).  General Appearance:  Disheveled, thin  Eye Contact::  Minimal  Speech:  Normal rate  Volume:  Normal  Mood:  fine  Affect:  Flat  Thought Process:  Tangential  Orientation:  Full (Time, Place, and Person)  Thought Content:  Delusions  Suicidal Thoughts:  No  Homicidal Thoughts:  No  Memory:  Immediate;   Good Recent;   Poor Remote;   Fair  Judgement:  Impaired  Insight:  Lacking  Psychomotor Activity:  Decreased  Concentration:  Poor  Recall:  Poor  Fund of Knowledge:Fair  Language: Good  Akathisia:  No  Handed:  Right  AIMS (if indicated):     Assets:  Housing Resilience  ADL's:  Intact  Cognition: Impaired,  Mild  Sleep:  Number of Hours: 8     Current Medications: Current Facility-Administered Medications  Medication Dose Route Frequency Provider Last Rate Last Dose  . acetaminophen (TYLENOL) tablet 650 mg  650 mg Oral Q6H PRN Gonzella Lex, MD      . alum & mag hydroxide-simeth (MAALOX/MYLANTA) 200-200-20 MG/5ML suspension 30 mL  30 mL Oral Q4H PRN Gonzella Lex, MD      . feeding supplement (ENSURE ENLIVE) (ENSURE ENLIVE) liquid 237 mL  237 mL Oral TID WC Marjie Skiff, MD   237 mL at 12/27/14 1244  . magnesium hydroxide (MILK OF MAGNESIA) suspension 30 mL  30 mL Oral Daily PRN Gonzella Lex, MD      . mirtazapine (REMERON) tablet 30 mg  30 mg Oral QHS Gonzella Lex, MD   30 mg at 12/26/14 2123  . OLANZapine (ZYPREXA) tablet 15 mg  15 mg Oral QHS Gonzella Lex, MD   15 mg at 12/26/14 2123  . sulfamethoxazole-trimethoprim (BACTRIM DS,SEPTRA DS) 800-160 MG per tablet 1 tablet  1 tablet Oral Q12H Gonzella Lex, MD   1 tablet at 12/27/14 1244    Lab Results:  Results for orders placed or performed during the hospital encounter of 12/25/14 (from the past 48 hour(s))  RPR     Status: None   Collection Time: 12/25/14  3:01 PM  Result Value Ref Range   RPR Ser Ql Non Reactive Non Reactive    Comment: (NOTE) Performed At: [facility] [place], [place]  [id number] Lindon Romp MD PP:[id number]   HIV antibody     Status: None   Collection Time: 12/25/14  3:01 PM  Result Value Ref Range   HIV Screen 4th Generation wRfx Non Reactive Non Reactive    Comment: (NOTE) Performed At: [facility] [address] [place], [place] [id number] Lindon Romp MD SN:[id number]   Urinalysis complete, with microscopic [facility] only)     Status: Abnormal   Collection Time: 12/26/14  7:20 AM  Result Value Ref Range   Color, Urine YELLOW (A) YELLOW   APPearance CLOUDY (A) CLEAR   Glucose, UA NEGATIVE NEGATIVE mg/dL   Bilirubin Urine NEGATIVE NEGATIVE   Ketones, ur TRACE (A) NEGATIVE mg/dL   Specific Gravity, Urine 1.023 1.005 - 1.030   Hgb urine dipstick 1+ (A) NEGATIVE  pH 5.0 5.0 - 8.0   Protein, ur NEGATIVE NEGATIVE mg/dL   Nitrite NEGATIVE NEGATIVE   Leukocytes, UA 3+ (A) NEGATIVE   RBC / HPF 6-30 0 - 5 RBC/hpf   WBC, UA TOO NUMEROUS TO COUNT 0 - 5 WBC/hpf   Bacteria, UA RARE (A) NONE SEEN   Squamous Epithelial / LPF 0-5 (A) NONE SEEN   Trans Epithel, UA 3    WBC Clumps PRESENT    Mucous PRESENT    Hyaline Casts, UA PRESENT   Urine Drug Screen, Qualitative ([facility] only)     Status: None   Collection Time: 12/26/14  7:20 AM  Result Value Ref Range   Tricyclic, Ur Screen NONE DETECTED NONE DETECTED   Amphetamines, Ur Screen NONE DETECTED NONE DETECTED   MDMA (Ecstasy)Ur Screen NONE DETECTED NONE DETECTED   Cocaine Metabolite,Ur [place] NONE DETECTED NONE DETECTED   Opiate, Ur Screen NONE DETECTED NONE DETECTED   Phencyclidine (PCP) Ur S NONE DETECTED NONE DETECTED   Cannabinoid 50 Ng, Ur [place] NONE DETECTED NONE DETECTED   Barbiturates, Ur Screen NONE DETECTED NONE DETECTED   Benzodiazepine, Ur Scrn NONE DETECTED NONE DETECTED   Methadone Scn, Ur NONE DETECTED NONE DETECTED    Comment: (NOTE) 709  Tricyclics, urine               Cutoff 1000 ng/mL 200  Amphetamines, urine             Cutoff 1000 ng/mL 300  MDMA (Ecstasy), urine            Cutoff 500 ng/mL 400  Cocaine Metabolite, urine       Cutoff 300 ng/mL 500  Opiate, urine                   Cutoff 300 ng/mL 600  Phencyclidine (PCP), urine      Cutoff 25 ng/mL 700  Cannabinoid, urine              Cutoff 50 ng/mL 800  Barbiturates, urine             Cutoff 200 ng/mL 900  Benzodiazepine, urine           Cutoff 200 ng/mL 1000 Methadone, urine                Cutoff 300 ng/mL 1100 1200 The urine drug screen provides only a preliminary, unconfirmed 1300 analytical test result and should not be used for non-medical 1400 purposes. Clinical consideration and professional judgment should 1500 be applied to any positive drug screen result due to possible 1600 interfering substances. A more specific alternate chemical method 1700 must be used in order to obtain a confirmed analytical result.  1800 Gas chromato graphy / mass spectrometry (GC/MS) is the preferred 1900 confirmatory method.     Physical Findings: AIMS: Facial and Oral Movements Muscles of Facial Expression: None, normal Lips and Perioral Area: None, normal Jaw: None, normal Tongue: None, normal,Extremity Movements Upper (arms, wrists, hands, fingers): None, normal Lower (legs, knees, ankles, toes): None, normal, Trunk Movements Neck, shoulders, hips: None, normal, Overall Severity Severity of abnormal movements (highest score from questions above): None, normal Incapacitation due to abnormal movements: None, normal Patient's awareness of abnormal movements (rate only patient's report): Aware, severe distress, Dental Status Current problems with teeth and/or dentures?: No Does patient usually wear dentures?: No  CIWA:  CIWA-Ar Total: 4 COWS:  COWS Total Score: 6  Treatment Plan Summary: Patient appears to be less labile and  irritable than described earlier in the admission. At this time he appears to be denying all emotional and physical issues. He does continue to assert what appears to be delusional and  paranoid ideation regarding people out to get him and his daughter.  1. Psychosis. Continue Olanzapine. Obtain collateral from [place]. 2. Malnutrion. Ensure and Dietary consult.    Medical Decision Making:  Review of Psycho-Social Stressors (1), Established Problem, Worsening (2), Review of Last Therapy Session (1), Review of Medication Regimen & Side Effects (2) and Review of New Medication or Change in Dosage (2)     Faith Rogue 12/27/2014, 2:45 PM

## 2014-12-27 NOTE — Progress Notes (Signed)
D: Pt denies SI/HI/AVH. Pt is pleasant and cooperative. Pt avoids. Pt continues to deny that he has any problems. Pt stated he heard on the loud speaker "the doctor told them to let me go". Pt is very paranoid. Pt forwards little and only responds to questions, pt does not forward any information unless asked.   A: Pt was offered support and encouragement. Pt was given scheduled medications. Pt was encourage to attend groups. Q 15 minute checks were done for safety.   R: Pt is taking medication. Pt has no complaints at this time .Pt receptive to treatment and safety maintained on unit.

## 2014-12-27 NOTE — Plan of Care (Signed)
Problem: Ineffective individual coping Goal: STG: Patient will remain free from self harm Outcome: Progressing Pt safe on the unit  Problem: Alteration in thought process Goal: STG-Patient is able to discuss thoughts with staff Outcome: Not Progressing Pt does not inform staff of how he's feeling, pt communicates one word responses

## 2014-12-27 NOTE — Progress Notes (Signed)
Recreation Therapy Notes  INPATIENT RECREATION THERAPY ASSESSMENT  Patient Details Name: Nathan Mcintosh MRN: [id number] DOB: [birth year] Today's Date: 12/27/2014  Patient Stressors:  Patient reported no stressors.  Coping Skills:   Avoidance, Exercise, Music, Substance Abuse  Personal Challenges:  Patient reported no personal challenges.  Leisure Interests (2+):  Individual - Other (Comment) Writer)  Awareness of Community Resources:  Yes  Community Resources:  Park  Current Use: No  If no, Barriers?: Other (Comment) (Just doesn't go)  Patient Strengths:  "I love myself"  Patient Identified Areas of Improvement:  No  Current Recreation Participation:  Listen to music  Patient Goal for Hospitalization:  To get married as soon as he getsout of here  [place] of Residence:  [place] of Residence:  Insurance underwriter   Current SI (including self-harm):  No  Current HI:  No  Consent to Intern Participation: N/A  Due to patient reporting no personal challenges, LRT will not develop a Recreational Therapy Care Plan. If patient's status changes, LRT will develop a Recreational Therapy Care Plan.  Leonette Monarch, LRT/CTRS 12/27/2014, 4:41 PM

## 2014-12-27 NOTE — Progress Notes (Signed)
Patient is alert and oriented x 4. He has some difficulty following unit routine and taking his medications on time. He is visible on the unit, no noted interaction with other patients. There are no s/s psychosis. Patient denies having a substance abuse problem. Will continue to monitor mental status, educate patient re reasons for admission and treatment plan.

## 2014-12-27 NOTE — Progress Notes (Signed)
D: Pt denies SI/HI/AVH. Pt is cooperative with care, appears less anxious not responding to internal stimuli. Patient isolates to room, minimal interaction with staff and peers.  A: Pt was offered support and encouragement, and scheduled medications. Pt was encouraged to attend groups. Q 15 minute checks were done for safety.  R: Pt did not attend group, minimal interaction wiith peers and staff, compliant with medication. Pt has no complaints.Pt receptive to treatment and safety maintained on unit.

## 2014-12-27 NOTE — Plan of Care (Signed)
Problem: Alteration in thought process Goal: LTG-Patient behavior demonstrates decreased signs psychosis (Patient behavior demonstrates decreased signs of psychosis to the point the patient is safe to return home and continue treatment in an outpatient setting.)  Outcome: Progressing Thoughts are organized, not responding to internal stimuli.

## 2014-12-27 NOTE — Progress Notes (Signed)
Recreation Therapy Notes  Date: 06.20.16 Time: 3:00 pm Location: Craft Room  Group Topic: Self-expression  Goal Area(s) Addresses:  Patient will identify one color per emotion listed on wheel. Patient will verbalize benefit of using art as a means of self-expression. Patient will verbalize one emotion experienced during session. Patient will be educated on other forms of self-expression.  Behavioral Response: Did not attend  Intervention: Emotion Wheel  Activity: Patients were given a worksheet with 7 different emotions and were instructed to pick a color for each emotion.   Education: LRT educated patient on different forms of self-expression.   Education Outcome: Patient did not attend group.  Clinical Observations/Feedback: Patient did not attend group.  Leonette Monarch, LRT/CTRS 12/27/2014 4:03 PM

## 2014-12-27 NOTE — Tx Team (Addendum)
Initial Interdisciplinary Treatment Plan   PATIENT STRESSORS: Medication change or noncompliance Substance abuse   PATIENT STRENGTHS: Capable of independent living Communication skills   PROBLEM LIST: Problem List/Patient Goals Date to be addressed Date deferred Reason deferred Estimated date of resolution  psychosis      Substance abuse                                                 DISCHARGE :  Medical problems require only outpatient monitoring Motivation to continue treatment in a less acute level of care  PRELIMINARY DISCHARGE PLAN: Attend aftercare/continuing care group Attend 12-step recovery group  PATIENT/FAMIILY INVOLVEMENT: This treatment plan has been presented to and reviewed with the patient, Nathan Mcintosh, and/or family member.  The patient and family have been given the opportunity to ask questions and make suggestions.  [name] 12/27/2014, 10:07 AM

## 2014-12-28 MED ORDER — OLANZAPINE 10 MG PO TABS
20.0000 mg | ORAL_TABLET | Freq: Every day | ORAL | Status: DC
  Administered 2014-12-28 – 2014-12-29 (×2): 20 mg via ORAL
  Filled 2014-12-28 (×4): qty 2

## 2014-12-28 NOTE — Progress Notes (Signed)
Patient offers little conversations, only answer yes or no questions. Refused Ensure. Med compliant. Isolates to self. Continues to hallucinate. Flat, Bizarre affect. Encouraged pt to attend group and verbalize feelings. Pt just starred. Will continue to assess and monitor for safety.

## 2014-12-28 NOTE — Progress Notes (Signed)
Recreation Therapy Notes  Date: 06.21.16 Time: 3:00 pm Location: Craft Room  Group Topic: Goal Setting   Goal Area(s) Addresses:  Patient will be able to identify one goal. Patient will verbalize benefit of setting goals. Patient will be able to identify at least one positive statement.  Behavioral Response: Left Early  Intervention: Step By Step  Activity: Patients were given a worksheet with a foot on it. Patients were instructed to write a goal on the inside of the foot and to write positive statements/advice on the outside of the foot.  Education: LRT educated patients on healthy ways to celebrate achieving their goals.   Education Outcome: Needs additional education.  Clinical Observations/Feedback: Patient left group at approximately 3:18 pm. Patient did not return to group.  Leonette Monarch, LRT/CTRS 12/28/2014 4:11 PM

## 2014-12-28 NOTE — Progress Notes (Signed)
Nutrition Follow-up    INTERVENTION: Medical food supplement: continueEnsure Enlive (each supplement provides 350kcal and 20 grams of protein) TID  NUTRITION DIAGNOSIS:  Inadequate oral intake related to acute illness as evidenced by meal completion < 25%. Improving as pt eating 90-100% of meals now   GOAL:  Patient will meet greater than or equal to 90% of their needs   MONITOR:   (Energy Intake, Anthropometrics)   Reason for Assessment Assessment of nutrition requirement/status  ASSESSMENT:  Diet Order: Regular  Energy Intake: recorded po intake 72% of meals on average, eating at least 90-100% of meals for last 48 hours  Height:  Ht Readings from Last 1 Encounters:  12/25/14 5\' 4"  (1.626 m)    Weight: no new weight  Wt Readings from Last 1 Encounters:  12/25/14 82 lb (37.195 kg)    Ideal Body Weight:     Wt Readings from Last 10 Encounters:  12/25/14 82 lb (37.195 kg)  12/24/14 90 lb (40.824 kg)   Labs: reviewed  Meds: reviewed  BMI:  Body mass index is 14.07 kg/(m^2).  Estimated Nutritional Needs:  Kcal:  1890-2233kcals, BEE: 1321kcals, TEE: (IF 1.1-1.3)(AF 1.3) using IBW of 59kg  Protein:  59-71g protein (1.0-1.2g/kg) using IBW of 59kg  Fluid:  1475-1776mL of fluid (25-26mL/kg) using IBW of 59kg  Skin:  Reviewed, no issues  Diet Order:  Diet regular Room service appropriate?: Yes; Fluid consistency:: Thin  EDUCATION NEEDS:  Education needs no appropriate at this time   Intake/Output Summary (Last 24 hours) at 12/28/14 1457 Last data filed at 12/28/14 0926  Gross per 24 hour  Intake    600 ml  Output      0 ml  Net    600 ml   MODERATE Care Level  Kerman Passey MS, RD, LDN [phone] Pager

## 2014-12-28 NOTE — Progress Notes (Signed)
D: Pt denies SI/HI/AVH. Pt is pleasant and cooperative. Pt continues to be delusional, pt insists there is nothing wrong with him and he believed he was leaving tonight. Pt observed interacting with peers in the dayroom, pt avoids Probation officer.   A: Pt was offered support and encouragement. Pt was given scheduled medications. Pt was encourage to attend groups. Q 15 minute checks were done for safety.   R:Pt attends groups and interacts well with peers . Pt is taking medication. Pt has no complaints.Pt receptive to treatment and safety maintained on unit.

## 2014-12-28 NOTE — B[facility] Group Notes (Signed)
[place] Group Notes:  (Nursing/MHT/Case Management/Adjunct)  Date:  12/28/2014  Time:  10:51 PM  Type of Therapy:  Group Therapy  Participation Level:  Minimal  Participation Quality:  Attentive  Affect:  Flat  Cognitive:  Alert  Insight:  Limited  Engagement in Group:  Limited  Modes of Intervention:  n/a  Summary of Progress/Problems:  Marylynn Pearson 12/28/2014, 10:51 PM

## 2014-12-28 NOTE — BHH Group Notes (Signed)
[place] Group Notes:  (Nursing/MHT/Case Management/Adjunct)  Date:  12/28/2014  Time:  3:22 PM  Type of Therapy:  Psychoeducational Skills  Participation Level:  Did Not Attend  Nathan Mcintosh 12/28/2014, 3:22 PM

## 2014-12-28 NOTE — Clinical Social Work Note (Signed)
CSW faxed patient to [facility] Ph [phone], Fax [phone] and will follow up for patient to have enhanced services at discharge as patient has several recent hospitalizations, has been medication non-compliant, and is currently homeless.

## 2014-12-28 NOTE — Progress Notes (Addendum)
[facility] MD Progress Note  12/28/2014 11:54 AM Nathan Mcintosh  MRN:  [id number] Subjective:  Follow-up for this 59 year old man who is currently psychotic. Patient is denying any disturbance at this time. When I reminded him of the material he spoke about yesterday such as people being after him and his daughter he indicated that was not an issue. When I again pressed him on that issue he seemed to be hesitant to discuss any of those issues. When asked if he thought he had a mental illness he was adamant that he did not. I discussed recommending medications such as an injection and he was adamantly against receiving any type of injection.  He has been guarded and kept to himself and under recently good behavioral control on the unit. Principal Problem: Severe recurrent major depression with psychotic features Diagnosis:   Patient Active Problem List   Diagnosis Date Noted  . 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 Severe major depression with psychotic features [F32.3] 12/24/2014  . Alcohol abuse [F10.10] 12/24/2014  . Malnutrition [E46] 12/24/2014   Total Time spent with patient: 30 minutes   Past Medical History: History reviewed. No pertinent past medical history. History reviewed. No pertinent past surgical history. Family History: History reviewed. No pertinent family history. Social History:  History  Alcohol Use  . 2.4 - 3.0 oz/week  . 4-5 Cans of beer per week     History  Drug Use No    History   Social History  . Marital Status: Single    Spouse Name: N/A  . Number of Children: N/A  . Years of Education: N/A   Social History Main Topics  . Smoking status: Current Some Day Smoker -- 1.00 packs/day for 30 years    Last Attempt to Quit: 12/25/2014  . Smokeless tobacco: Not on file  . Alcohol Use: 2.4 - 3.0 oz/week    4-5 Cans of beer per week  . Drug Use: No  . Sexual Activity: Not Currently   Other Topics Concern  . None    Social History Narrative   Additional History:    Sleep: Fair  Appetite:  Fair   Assessment:   Musculoskeletal: Strength & Muscle Tone: within normal limits Gait & Station: normal Patient leans: N/A   Psychiatric Specialty Exam: Physical Exam  Constitutional: He appears well-developed. He appears cachectic. He has a sickly appearance.  HENT:  Head: Normocephalic and atraumatic.  Eyes: Conjunctivae are normal. Pupils are equal, round, and reactive to light.  Neck: Normal range of motion.  Cardiovascular: Normal heart sounds.   Respiratory: Effort normal.  GI: Soft.  Musculoskeletal: Normal range of motion.  Neurological: He is alert.  Skin: Skin is warm and dry.  Psychiatric: His speech is normal. His affect is blunt and labile. He is withdrawn and actively hallucinating. Thought content is delusional. Cognition and memory are impaired. He expresses inappropriate judgment.    Review of Systems  Constitutional: Negative.   HENT: Negative.   Eyes: Negative.   Respiratory: Negative.   Cardiovascular: Negative.   Gastrointestinal: Negative.   Musculoskeletal: Negative.   Skin: Negative.   Neurological: Negative.   Psychiatric/Behavioral: Positive for hallucinations. Negative for depression, suicidal ideas, memory loss and substance abuse. The patient is nervous/anxious. The patient does not have insomnia.     Blood pressure 135/94, pulse 102, temperature 98.8 F (37.1 C), temperature source Oral, resp. rate 20, height 5\' 4"  (1.626 m), weight 37.195 kg (82 lb), SpO2 98 %.  Body mass index is 14.07 kg/(m^2).  General Appearance: Disheveled, thin  Eye Contact::  Minimal  Speech:  Normal rate  Volume:  Normal  Mood:  fine  Affect:  Flat  Thought Process:  Tangential  Orientation:  Full (Time, Place, and Person)  Thought Content:  Delusions  Suicidal Thoughts:  No  Homicidal Thoughts:  No  Memory:  Immediate;   Good Recent;   Poor Remote;   Fair  Judgement:   Impaired  Insight:  Lacking  Psychomotor Activity:  Decreased  Concentration:  Poor  Recall:  Poor  Fund of Knowledge:Fair  Language: Good  Akathisia:  No  Handed:  Right  AIMS (if indicated):     Assets:  Housing Resilience  ADL's:  Intact  Cognition: Impaired,  Mild  Sleep:  Number of Hours: 7     Current Medications: Current Facility-Administered Medications  Medication Dose Route Frequency Provider Last Rate Last Dose  . acetaminophen (TYLENOL) tablet 650 mg  650 mg Oral Q6H PRN Gonzella Lex, MD      . alum & mag hydroxide-simeth (MAALOX/MYLANTA) 200-200-20 MG/5ML suspension 30 mL  30 mL Oral Q4H PRN Gonzella Lex, MD      . feeding supplement (ENSURE ENLIVE) (ENSURE ENLIVE) liquid 237 mL  237 mL Oral TID WC Marjie Skiff, MD   237 mL at 12/28/14 0800  . LORazepam (ATIVAN) tablet 1 mg  1 mg Oral Q6H PRN Marjie Skiff, MD      . magnesium hydroxide (MILK OF MAGNESIA) suspension 30 mL  30 mL Oral Daily PRN Gonzella Lex, MD      . mirtazapine (REMERON) tablet 30 mg  30 mg Oral QHS Gonzella Lex, MD   30 mg at 12/27/14 2139  . OLANZapine (ZYPREXA) tablet 15 mg  15 mg Oral QHS Gonzella Lex, MD   15 mg at 12/27/14 2139  . sulfamethoxazole-trimethoprim (BACTRIM DS,SEPTRA DS) 800-160 MG per tablet 1 tablet  1 tablet Oral Q12H Gonzella Lex, MD   1 tablet at 12/28/14 0826  . traZODone (DESYREL) tablet 50 mg  50 mg Oral QHS PRN Marjie Skiff, MD        Lab Results:  No results found for this or any previous visit (from the past 48 hour(s)).  Physical Findings: AIMS: Facial and Oral Movements Muscles of Facial Expression: None, normal Lips and Perioral Area: None, normal Jaw: None, normal Tongue: None, normal,Extremity Movements Upper (arms, wrists, hands, fingers): None, normal Lower (legs, knees, ankles, toes): None, normal, Trunk Movements Neck, shoulders, hips: None, normal, Overall Severity Severity of abnormal movements (highest score from questions  above): None, normal Incapacitation due to abnormal movements: None, normal Patient's awareness of abnormal movements (rate only patient's report): Aware, severe distress, Dental Status Current problems with teeth and/or dentures?: No Does patient usually wear dentures?: No  CIWA:  CIWA-Ar Total: 4 COWS:  COWS Total Score: 6  Treatment Plan Summary: Patient appears to be less labile and irritable than described earlier in the admission. At this time he appears to be denying all emotional and physical issues. He does continue to assert what appears to be delusional and paranoid ideation regarding people out to get him and his daughter.   1. Psychosis. Will increase Olanzapine to 20 mg. Social work has obtained some information from his outpatient history. It does appear patient is homeless and has had difficulty with follow-up and compliance. We will discuss this more at treatment team. 2. Malnutrion.  Ensure and Dietary consult.    Medical Decision Making:  Review of Psycho-Social Stressors (1), Established Problem, Worsening (2), Review of Last Therapy Session (1), Review of Medication Regimen & Side Effects (2) and Review of New Medication or Change in Dosage (2)     Faith Rogue 12/28/2014, 11:54 AM

## 2014-12-29 NOTE — [facility] Group Notes (Signed)
[facility] Group Notes:  (Nursing/MHT/Case Management/Adjunct)  Date:  12/29/2014  Time:  12:14 PM  Type of Therapy:  Psychoeducational Skills  Participation Level:  Did Not Attend   [name] 12/29/2014, 12:14 PM

## 2014-12-29 NOTE — Tx Team (Signed)
Interdisciplinary Treatment Plan Update (Adult)  Date:  12/29/2014 Time Reviewed:  12:26 PM  Progress in Treatment: Attending groups: No. Participating in groups:  No. Taking medication as prescribed:  Yes. Tolerating medication:  Yes. Family/Significant othe contact made:  No, will contact:  if patient will provide consent Patient understands diagnosis:  No. and As evidenced by:  patient's report of paranoid ideations and states he does not have mental health issues Discussing patient identified problems/goals with staff:  Yes. Medical problems stabilized or resolved:  Yes. Denies suicidal/homicidal ideation: Yes. Issues/concerns per patient self-inventory:  No. Other:  New problem(s) identified: No, Describe:  none reported  Discharge Plan or Barriers: Patient is currently homeless but has friends he can stay with temporarily. Patient does not follow up outpatient but has been seen by [facility] CST in the past. Patient is referred to [facility] and may be on apartment waitlist in [place].   Reason for Continuation of Hospitalization: Delusions  Medication stabilization  Comments:  Estimated length of stay: up to 3 days  New goal(s):  Review of initial/current patient goals per problem list:   See Care Plan  Attendees: Physician:  Faith Rogue, MD 6/22/201612:26 PM  Nursing:   Floyde Parkins, RN 6/22/201612:26 PM  Other:  Carmell Austria, LCSWA 6/22/201612:26 PM  Other:  Dossie Arbour, LCSW 6/22/201612:26 PM  Other:  Everitt Amber, LRT 6/22/201612:26 PM  Other: Garald Braver, Psych D.  6/22/201612:26 PM   Scribe for Treatment Team:   Carmell Austria T, 12/29/2014, 12:26 PM

## 2014-12-29 NOTE — Progress Notes (Signed)
Recreation Therapy Notes  Date: 06.22.16 Time: 3:00 pm Location: Craft Room  Group Topic: Self-esteem  Goal Area(s) Addresses:  Patient will be able to identify benefit of self-esteem. Patient will be able to identify ways to increase self-esteem.  Behavioral Response: Attentive, Left Early  Intervention: Self-Portrait  Activity: Patients were instructed to draw their self-portrait, write something positive about themselves and their peers, and draw their self-portrait after they read the positive things peers wrote.   Education:LRT educated patients on ways to increase their self-esteem.   Education Outcome: Acknowledges education/In group clarification offered  Clinical Observations/Feedback: Patient drew one self-portrait. Patient left group at approximately 3:14 pm to use the bathroom. Patient did not return to group.  Leonette Monarch, LRT/CTRS 12/29/2014 4:38 PM

## 2014-12-29 NOTE — Progress Notes (Signed)
[facility] MD Progress Note  12/29/2014 3:50 PM Nathan Mcintosh  MRN:  [id number] Subjective:  Follow-up for this 59 year old man who is currently psychotic. Today patient denied with writer any psychotic symptoms such as delusions or hallucinations. However this may not be entirely truthful. Social worker from outside outpatient agency met who is familiar with patient met with him and patient reportedly told that worker that he was hearing voices of relatives through the speakers in the hospital intercom system.  Despite ongoing psychosis, he has been guarded and kept to himself and under recently good behavioral control on the unit. He asked about any physical issues he did state he might have had some constipation. I offered her medication but he declined. I did state that I could offer him something to be as needed and he stated that would be fine. Patient is already ready written for milk of magnesia. Principal Problem: Severe recurrent major depression with psychotic features Diagnosis:   Patient Active Problem List   Diagnosis Date Noted  . 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 Severe major depression with psychotic features [F32.3] 12/24/2014  . Alcohol abuse [F10.10] 12/24/2014  . Malnutrition [E46] 12/24/2014   Total Time spent with patient: 30 minutes   Past Medical History: History reviewed. No pertinent past medical history. History reviewed. No pertinent past surgical history. Family History: History reviewed. No pertinent family history. Social History:  History  Alcohol Use  . 2.4 - 3.0 oz/week  . 4-5 Cans of beer per week     History  Drug Use No    History   Social History  . Marital Status: Single    Spouse Name: N/A  . Number of Children: N/A  . Years of Education: N/A   Social History Main Topics  . Smoking status: Current Some Day Smoker -- 1.00 packs/day for 30 years    Last Attempt to Quit: 12/25/2014  . Smokeless  tobacco: Not on file  . Alcohol Use: 2.4 - 3.0 oz/week    4-5 Cans of beer per week  . Drug Use: No  . Sexual Activity: Not Currently   Other Topics Concern  . None   Social History Narrative   Additional History:    Sleep: Fair  Appetite:  Fair   Assessment:   Musculoskeletal: Strength & Muscle Tone: within normal limits Gait & Station: normal Patient leans: N/A   Psychiatric Specialty Exam: Physical Exam  Constitutional: He appears well-developed. He appears cachectic. He has a sickly appearance.  HENT:  Head: Normocephalic and atraumatic.  Eyes: Conjunctivae are normal. Pupils are equal, round, and reactive to light.  Neck: Normal range of motion.  Cardiovascular: Normal heart sounds.   Respiratory: Effort normal.  GI: Soft.  Musculoskeletal: Normal range of motion.  Neurological: He is alert.  Skin: Skin is warm and dry.  Psychiatric: His speech is normal. His affect is blunt and labile. He is withdrawn and actively hallucinating. Thought content is delusional. Cognition and memory are impaired. He expresses inappropriate judgment.    Review of Systems  Constitutional: Negative.   HENT: Negative.   Eyes: Negative.   Respiratory: Negative.   Cardiovascular: Negative.   Gastrointestinal: Negative.   Musculoskeletal: Negative.   Skin: Negative.   Neurological: Negative.   Psychiatric/Behavioral: Positive for hallucinations. Negative for depression, suicidal ideas, memory loss and substance abuse. The patient is nervous/anxious. The patient does not have insomnia.     Blood pressure 117/82, pulse 81, temperature 97.8  F (36.6 C), temperature source Oral, resp. rate 20, height 5' 4"  (1.626 m), weight 37.195 kg (82 lb), SpO2 94 %.Body mass index is 14.07 kg/(m^2).  General Appearance: Disheveled, thin  Eye Contact::  Minimal  Speech:  Normal rate  Volume:  Normal  Mood:  fine  Affect:  Flat  Thought Process:  Tangential  Orientation:  Full (Time, Place, and  Person)  Thought Content:  Delusions  Suicidal Thoughts:  No  Homicidal Thoughts:  No  Memory:  Immediate;   Good Recent;   Poor Remote;   Fair  Judgement:  Impaired  Insight:  Lacking  Psychomotor Activity:  Decreased  Concentration:  Poor  Recall:  Poor  Fund of Knowledge:Fair  Language: Good  Akathisia:  No  Handed:  Right  AIMS (if indicated):     Assets:  Housing Resilience  ADL's:  Intact  Cognition: Impaired,  Mild  Sleep:  Number of Hours: 6.25     Current Medications: Current Facility-Administered Medications  Medication Dose Route Frequency Provider Last Rate Last Dose  . acetaminophen (TYLENOL) tablet 650 mg  650 mg Oral Q6H PRN Gonzella Lex, MD      . alum & mag hydroxide-simeth (MAALOX/MYLANTA) 200-200-20 MG/5ML suspension 30 mL  30 mL Oral Q4H PRN Gonzella Lex, MD      . feeding supplement (ENSURE ENLIVE) (ENSURE ENLIVE) liquid 237 mL  237 mL Oral TID WC Marjie Skiff, MD   237 mL at 12/29/14 1203  . LORazepam (ATIVAN) tablet 1 mg  1 mg Oral Q6H PRN Marjie Skiff, MD      . magnesium hydroxide (MILK OF MAGNESIA) suspension 30 mL  30 mL Oral Daily PRN Gonzella Lex, MD      . mirtazapine (REMERON) tablet 30 mg  30 mg Oral QHS Gonzella Lex, MD   30 mg at 12/28/14 2151  . OLANZapine (ZYPREXA) tablet 20 mg  20 mg Oral QHS Marjie Skiff, MD   20 mg at 12/28/14 2151  . sulfamethoxazole-trimethoprim (BACTRIM DS,SEPTRA DS) 800-160 MG per tablet 1 tablet  1 tablet Oral Q12H Gonzella Lex, MD   1 tablet at 12/29/14 0752  . traZODone (DESYREL) tablet 50 mg  50 mg Oral QHS PRN Marjie Skiff, MD        Lab Results:  No results found for this or any previous visit (from the past 48 hour(s)).  Physical Findings: AIMS: Facial and Oral Movements Muscles of Facial Expression: None, normal Lips and Perioral Area: None, normal Jaw: None, normal Tongue: None, normal,Extremity Movements Upper (arms, wrists, hands, fingers): None, normal Lower (legs, knees,  ankles, toes): None, normal, Trunk Movements Neck, shoulders, hips: None, normal, Overall Severity Severity of abnormal movements (highest score from questions above): None, normal Incapacitation due to abnormal movements: None, normal Patient's awareness of abnormal movements (rate only patient's report): Aware, severe distress, Dental Status Current problems with teeth and/or dentures?: No Does patient usually wear dentures?: No  CIWA:  CIWA-Ar Total: 4 COWS:  COWS Total Score: 6  Treatment Plan Summary: Patient appears to be less labile and irritable than described earlier in the admission. At this time he appears to be denying all emotional and physical issues. He does continue to assert what appears to be delusional and paranoid ideation regarding people out to get him and his daughter.   1. Psychosis. Continue Olanzapine to 20 mg. Social work has obtained some information from his outpatient history. It does appear patient  is homeless and has had difficulty with follow-up and compliance.  2. Malnutrion. Ensure per dietary recommendations.    Medical Decision Making:  Review of Psycho-Social Stressors (1), Established Problem, Worsening (2), Review of Last Therapy Session (1), Review of Medication Regimen & Side Effects (2) and Review of New Medication or Change in Dosage (2)     Faith Rogue 12/29/2014, 3:50 PM

## 2014-12-29 NOTE — Plan of Care (Signed)
Problem: Ineffective individual coping Goal: STG: Patient will remain free from self harm Outcome: Progressing No self harm observed or reported  Problem: Alteration in thought process Goal: LTG-Patient has not harmed self or others in at least 2 days Outcome: Progressing No harm to self or others

## 2014-12-29 NOTE — Progress Notes (Signed)
Pt asked to give urine sample, earlier in the evening,  pt stated he did not see his sample cup. Pt shown cup and informed how to give sample.

## 2014-12-29 NOTE — Progress Notes (Signed)
D: Pt denies SI/HI/AVH. Pt is pleasant and cooperative. Pt continues to be delusional, pt insists there is nothing wrong with him. Continues to hallucinate.   A: Pt was offered support and encouragement. Pt was given scheduled medications. Pt was encourage to attend groups. Q 15 minute checks were done for safety.   R:Pt attends groups and interacts well with peers . Pt is taking medication. Pt has no complaints.Pt receptive to treatment and safety maintained on unit.

## 2014-12-30 NOTE — [facility] Group Notes (Signed)
[facility] Group Notes:  (Nursing/MHT/Case Management/Adjunct)  Date:  12/30/2014  Time:  3:53 PM  Type of Therapy:  Movement Therapy  Participation Level:  Did Not Attend  Summary of Progress/Problems:  Nathan Mcintosh Nathan Mcintosh 12/30/2014, 3:53 PM

## 2014-12-30 NOTE — Plan of Care (Signed)
Problem: Alteration in thought process Goal: LTG-Patient is able to perceive the environment accurately Outcome: Progressing Alert and oriented x 4, aware of environment.

## 2014-12-30 NOTE — Progress Notes (Signed)
  [facility] Adult Case Management Discharge Plan :  Will you be returning to the same living situation after discharge:  Yes,  patient to discharge home to previous living situation with a friend At discharge, do you have transportation home?: Yes,  patients states family will pick him up but cab voucher is available is needed. Do you have the ability to pay for your medications: Yes,  patient has Medicaid  Release of information consent forms completed and in the chart;  Patient's signature needed at discharge.  Patient to Follow up at: Follow-up Information    Follow up with [facility]. Go in 4 days.   Why:  For follow-up care; walkin appts Monday 01/03/15 at [place] information:   [place], [place] Ph [phone] Fax [phone]      Patient denies SI/HI: Yes,  patient denies SI/HI    Safety Planning and Suicide Prevention discussed: Yes,  SPE discussed with patient but patient refused family  Have you used any form of tobacco in the last 30 days? (Cigarettes, Smokeless Tobacco, Cigars, and/or Pipes): Yes  Has patient been referred to the Quitline?: Patient refused referral  Carmell Austria T 12/30/2014, 12:53 PM

## 2014-12-30 NOTE — Plan of Care (Signed)
Problem: Ineffective individual coping Goal: LTG: Patient will report a decrease in negative feelings Outcome: Progressing Patient encouraged to express feelings, "I need to go home, there is nothing wrong with me .." Observed in the day room with limited interaction with others.

## 2014-12-30 NOTE — Progress Notes (Signed)
[facility] MD Progress Note  12/30/2014 1:55 PM Nathan Mcintosh  MRN:  [id number] Subjective:  Follow-up for this 59 year old man who is currently psychotic. He again denies any psychotic symptoms such as hallucinations or delusions. He denies any physical aches or pains. He continues to isolate in his room but has not been of any kind of behavioral disturbance. He is tolerating Zyprexa well.  Principal Problem: Severe recurrent major depression with psychotic features Diagnosis:   Patient Active Problem List   Diagnosis Date Noted  . 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 Severe major depression with psychotic features [F32.3] 12/24/2014  . Alcohol abuse [F10.10] 12/24/2014  . Malnutrition [E46] 12/24/2014   Total Time spent with patient: 30 minutes   Past Medical History: History reviewed. No pertinent past medical history. History reviewed. No pertinent past surgical history. Family History: History reviewed. No pertinent family history. Social History:  History  Alcohol Use  . 2.4 - 3.0 oz/week  . 4-5 Cans of beer per week     History  Drug Use No    History   Social History  . Marital Status: Single    Spouse Name: N/A  . Number of Children: N/A  . Years of Education: N/A   Social History Main Topics  . Smoking status: Current Some Day Smoker -- 1.00 packs/day for 30 years    Last Attempt to Quit: 12/25/2014  . Smokeless tobacco: Not on file  . Alcohol Use: 2.4 - 3.0 oz/week    4-5 Cans of beer per week  . Drug Use: No  . Sexual Activity: Not Currently   Other Topics Concern  . None   Social History Narrative   Additional History:    Sleep: Fair  Appetite:  Fair   Assessment:   Musculoskeletal: Strength & Muscle Tone: within normal limits Gait & Station: normal Patient leans: N/A   Psychiatric Specialty Exam: Physical Exam  Constitutional: He appears well-developed. He appears cachectic. He has a sickly  appearance.  HENT:  Head: Normocephalic and atraumatic.  Eyes: Conjunctivae are normal. Pupils are equal, round, and reactive to light.  Neck: Normal range of motion.  Cardiovascular: Normal heart sounds.   Respiratory: Effort normal.  GI: Soft.  Musculoskeletal: Normal range of motion.  Neurological: He is alert.  Skin: Skin is warm and dry.  Psychiatric: His speech is normal. His affect is blunt and labile. He is withdrawn and actively hallucinating. Thought content is delusional. Cognition and memory are impaired. He expresses inappropriate judgment.    Review of Systems  Constitutional: Negative.   HENT: Negative.   Eyes: Negative.   Respiratory: Negative.   Cardiovascular: Negative.   Gastrointestinal: Negative.   Musculoskeletal: Negative.   Skin: Negative.   Neurological: Negative.   Psychiatric/Behavioral: Positive for hallucinations. Negative for depression, suicidal ideas, memory loss and substance abuse. The patient is nervous/anxious. The patient does not have insomnia.     Blood pressure 117/84, pulse 96, temperature 97.9 F (36.6 C), temperature source Oral, resp. rate 20, height 5\' 4"  (1.626 m), weight 37.195 kg (82 lb), SpO2 94 %.Body mass index is 14.07 kg/(m^2).  General Appearance: Disheveled, thin  Eye Contact::  Minimal  Speech:  Normal rate  Volume:  Normal  Mood:  fine  Affect:  Flat  Thought Process:  Tangential  Orientation:  Full (Time, Place, and Person)  Thought Content:  Delusions  Suicidal Thoughts:  No  Homicidal Thoughts:  No  Memory:  Immediate;  Good Recent;   Poor Remote;   Fair  Judgement:  Impaired  Insight:  Lacking  Psychomotor Activity:  Decreased  Concentration:  Poor  Recall:  Poor  Fund of Knowledge:Fair  Language: Good  Akathisia:  No  Handed:  Right  AIMS (if indicated):     Assets:  Housing Resilience  ADL's:  Intact  Cognition: Impaired,  Mild  Sleep:  Number of Hours: 5.5     Current Medications: Current  Facility-Administered Medications  Medication Dose Route Frequency Provider Last Rate Last Dose  . acetaminophen (TYLENOL) tablet 650 mg  650 mg Oral Q6H PRN Gonzella Lex, MD      . alum & mag hydroxide-simeth (MAALOX/MYLANTA) 200-200-20 MG/5ML suspension 30 mL  30 mL Oral Q4H PRN Gonzella Lex, MD      . feeding supplement (ENSURE ENLIVE) (ENSURE ENLIVE) liquid 237 mL  237 mL Oral TID WC Marjie Skiff, MD   237 mL at 12/30/14 1200  . LORazepam (ATIVAN) tablet 1 mg  1 mg Oral Q6H PRN Marjie Skiff, MD      . magnesium hydroxide (MILK OF MAGNESIA) suspension 30 mL  30 mL Oral Daily PRN Gonzella Lex, MD      . mirtazapine (REMERON) tablet 30 mg  30 mg Oral QHS Gonzella Lex, MD   30 mg at 12/29/14 2151  . OLANZapine (ZYPREXA) tablet 20 mg  20 mg Oral QHS Marjie Skiff, MD   20 mg at 12/29/14 2151  . sulfamethoxazole-trimethoprim (BACTRIM DS,SEPTRA DS) 800-160 MG per tablet 1 tablet  1 tablet Oral Q12H Gonzella Lex, MD   1 tablet at 12/30/14 1020  . traZODone (DESYREL) tablet 50 mg  50 mg Oral QHS PRN Marjie Skiff, MD        Lab Results:  No results found for this or any previous visit (from the past 48 hour(s)).  Physical Findings: AIMS: Facial and Oral Movements Muscles of Facial Expression: None, normal Lips and Perioral Area: None, normal Jaw: None, normal Tongue: None, normal,Extremity Movements Upper (arms, wrists, hands, fingers): None, normal Lower (legs, knees, ankles, toes): None, normal, Trunk Movements Neck, shoulders, hips: None, normal, Overall Severity Severity of abnormal movements (highest score from questions above): None, normal Incapacitation due to abnormal movements: None, normal Patient's awareness of abnormal movements (rate only patient's report): Aware, severe distress, Dental Status Current problems with teeth and/or dentures?: No Does patient usually wear dentures?: No  CIWA:  CIWA-Ar Total: 4 COWS:  COWS Total Score: 6  Treatment Plan  Summary: Patient appears to be less labile and irritable than described earlier in the admission. At this time he appears to be denying all emotional and physical issues. He does continue to assert what appears to be delusional and paranoid ideation regarding people out to get him and his daughter.   1. Psychosis. Continue Olanzapine to 20 mg. Social work has obtained some information from his outpatient history. It does appear patient is homeless and has had difficulty with follow-up and compliance.  2. Malnutrion. Ensure per dietary recommendations.    Medical Decision Making:  Review of Psycho-Social Stressors (1), Established Problem, Worsening (2), Review of Last Therapy Session (1), Review of Medication Regimen & Side Effects (2) and Review of New Medication or Change in Dosage (2)     Faith Rogue 12/30/2014, 1:55 PM

## 2014-12-30 NOTE — Progress Notes (Signed)
D: Pt denies SI/HI/AVH. Pt is pleasant and cooperative. Pt stated he feels better after resting,  he appears less anxious and he is interacting with peers and staff appropriately.  A: Pt was offered support and encouragement. Pt was given scheduled medications. Pt was encouraged to attend groups. Q 15 minute checks were done for safety.  R:Pt attends groups and interacts well with peers and staff. Pt is taking medication. Pt has no complaints.Pt receptive to treatment and safety maintained on unit.

## 2014-12-30 NOTE — BHH Group Notes (Signed)
[place] Group Notes:  (Nursing/MHT/Case Management/Adjunct)  Date:  12/30/2014  Time:  9:17 AM  Type of Therapy:  Community Meeting   Participation Level:  Did Not Attend  Summary of Progress/Problems:  Nathan Mcintosh Nathan Mcintosh 12/30/2014, 9:17 AM

## 2014-12-30 NOTE — Progress Notes (Signed)
Recreation Therapy Notes  Date: 06.23.16 Time: 3:00 pm Location: Craft Room  Group Topic: Leisure Education  Goal Area(s) Addresses:  Patient will identify one positive leisure activity.  Behavioral Response: Did not attend  Intervention: Leisure Time  Activity: Patients were instructed to write down one positive leisure activity. Patients were instructed to completed "Leisure Time Clock" worksheet. Patients were instructed to list 10 positive emotions as a group. Patients were instructed to match the leisure activities with the emotions.   Education: LRT educated patient on what was needed to participate in leisure.   Education Outcome: Patient did not attend group.  Clinical Observations/Feedback: Patient did not attend group.  Leonette Monarch, LRT/CTRS 12/30/2014 4:19 PM

## 2014-12-30 NOTE — Progress Notes (Signed)
Isolative, withdrawn, limited interaction with others; mouth check for cheeking, disheveled looking, will monitor

## 2014-12-30 NOTE — BHH Suicide Risk Assessment (Signed)
[place] INPATIENT:  Family/Significant Other Suicide Prevention Education  Suicide Prevention Education:  Patient Refusal for Family/Significant Other Suicide Prevention Education: The patient Nathan Mcintosh has refused to provide written consent for family/significant other to be provided Family/Significant Other Suicide Prevention Education during admission and/or prior to discharge.  Physician notified.  Carmell Austria T 12/30/2014, 12:52 PM

## 2014-12-30 NOTE — BHH Group Notes (Signed)
[place] Group Notes:  (Nursing/MHT/Case Management/Adjunct)  Date:  12/30/2014  Time:  4:26 AM  Type of Therapy:  Psychoeducational Skills  Participation Level:  Did Not Attend   Kathi Ludwig 12/30/2014, 4:26 AM

## 2014-12-30 NOTE — Plan of Care (Signed)
Problem: Alteration in thought process Goal: STG-Patient is able to follow short directions Outcome: Progressing Patient follows simple command.

## 2014-12-31 MED ORDER — MIRTAZAPINE 30 MG PO TABS: 30.0000 mg | ORAL_TABLET | Freq: Every day | ORAL | Status: DC

## 2014-12-31 MED ORDER — OLANZAPINE 20 MG PO TABS: 20.0000 mg | ORAL_TABLET | Freq: Every day | ORAL | Status: DC

## 2014-12-31 NOTE — BHH Group Notes (Signed)
[place] Group Notes:  (Nursing/MHT/Case Management/Adjunct)  Date:  12/31/2014  Time:  11:47 AM  Type of Therapy:  Group Therapy  Participation Level:  Minimal  Participation Quality:  Attentive and Sharing  Affect:  Flat  Cognitive:  Alert, Appropriate and Oriented  Insight:  Improving  Engagement in Group:  Lacking  Modes of Intervention:  Activity  Summary of Progress/Problems:  Nathan Mcintosh 12/31/2014, 11:47 AM

## 2014-12-31 NOTE — Progress Notes (Signed)
AVS H&P Discharge Summary faxed to [facility] for hospital follow-up

## 2014-12-31 NOTE — BHH Group Notes (Signed)
[facility] LCSW Aftercare Discharge Planning Group Note  12/31/2014 10:55 AM  Participation Quality:  Attentive  Affect:  Flat  Cognitive:  Alert  Insight:  Improving  Engagement in Group:  Limited  Modes of Intervention:  Socialization  Summary of Progress/Problems: Patient attended group but participated minimally. Patient shared that his SMART goal is to "go home today".   Carmell Austria T 12/31/2014, 10:55 AM

## 2014-12-31 NOTE — Progress Notes (Signed)
D: Pt is awake and active in the unit this evening. Pt mood is paranoid and his affect is anxious. Pt is refusing medications stating that "I already took pills tonight. You aint gonna keep drugin me up." Pt appears irritated and seems to be responding to internal stimuli, although he is redirectable and follows staff instructions.  A: Writer encouraged medication compliance and promoted sleep.  R: Pt still refuses to take medication and is not sleeping, but is otherwise cooperative with staff.

## 2014-12-31 NOTE — BHH Group Notes (Signed)
[place] Group Notes:  (Nursing/MHT/Case Management/Adjunct)  Date:  12/31/2014  Time:  4:14 AM  Type of Therapy:  Group Therapy  Participation Level:  None    Summary of Progress/Problems: PT left early.  Jenetta Downer Calypso Hagarty 12/31/2014, 4:14 AM

## 2014-12-31 NOTE — BHH Suicide Risk Assessment (Signed)
[facility] Discharge Suicide Risk Assessment   Demographic Factors:  Male, Low socioeconomic status, Living alone and Unemployed  Total Time spent with patient: 30 minutes  Musculoskeletal: Strength & Muscle Tone: within normal limits Gait & Station: normal Patient leans: N/A  Psychiatric Specialty Exam: Physical Exam  ROS  Blood pressure 142/103, pulse 118, temperature 97.5 F (36.4 C), temperature source Oral, resp. rate 20, height 5\' 4"  (1.626 m), weight 37.195 kg (82 lb), SpO2 94 %.Body mass index is 14.07 kg/(m^2).                                                       Have you used any form of tobacco in the last 30 days? (Cigarettes, Smokeless Tobacco, Cigars, and/or Pipes): Yes  Has this patient used any form of tobacco in the last 30 days? (Cigarettes, Smokeless Tobacco, Cigars, and/or Pipes) No  Mental Status Per Nursing Assessment::   On Admission:  NA  Current Mental Status by Physician: NA  Loss Factors: Financial problems/change in socioeconomic status  Historical Factors: Homeless, alcohol use disorder, no past suicide attempts  Risk Reduction Factors:   Positive therapeutic relationship and Patient is familiar with his treatment provider and resources., Will be connected with services for his mental health and substance use.  Continued Clinical Symptoms:  Schizophrenia:   Paranoid or undifferentiated type  Cognitive Features That Contribute To Risk:  Closed-mindedness    Suicide Risk:  Minimal: No identifiable suicidal ideation.  Patients presenting with no risk factors but with morbid ruminations; may be classified as minimal risk based on the severity of the depressive symptoms  Principal Problem: Severe recurrent major depression with psychotic features Discharge Diagnoses:  Patient Active Problem List   Diagnosis Date Noted  . 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 Severe major depression  with psychotic features [F32.3] 12/24/2014  . Alcohol abuse [F10.10] 12/24/2014  . Malnutrition [E46] 12/24/2014    Follow-up Information    Follow up with [facility]. Go in 4 days.   Why:  For follow-up care; walkin appts Monday 01/03/15 at [place] information:   [place], [place] Ph [phone] Fax [phone]      Plan Of Care/Follow-up recommendations:  Other:  Follow-up with outpatient providers.  Is patient on multiple antipsychotic therapies at discharge:  No   Has Patient had three or more failed trials of antipsychotic monotherapy by history:  No  Recommended Plan for Multiple Antipsychotic Therapies: NA    Faith Rogue 12/31/2014, 10:53 AM

## 2014-12-31 NOTE — Progress Notes (Signed)
Pleasant and cooperative.  Denies SI, depression and AVH.  Medication compliant.  Continues to stay to self. Discharge instructions given, verbalized understanding.  Prescriptions given and persona belongings returned.  Escorted off unit by this Probation officer to main entrance to catch taxi to travel home.

## 2014-12-31 NOTE — Plan of Care (Signed)
Problem: Ineffective individual coping Goal: STG: Patient will remain free from self harm Outcome: Progressing No self-harm.      

## 2014-12-31 NOTE — Discharge Summary (Signed)
Physician Discharge Summary Note  Patient:  Nathan Mcintosh is an 59 y.o., male MRN:  [id number] DOB:  Apr 18, 1956 Patient phone:  [phone] (home)  Patient address:   [place] [address],  Total Time spent with patient: 30 minutes  Date of Admission:  12/25/2014 Date of Discharge: 12/31/2014  Reason for Admission:  Disorganized behavior, being disruptive in public  Principal Problem: Severe recurrent major depression with psychotic features Discharge Diagnoses: Patient Active Problem List   Diagnosis Date Noted  . 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 Severe major depression with psychotic features [F32.3] 12/24/2014  . Alcohol abuse [F10.10] 12/24/2014  . Malnutrition [E46] 12/24/2014    Musculoskeletal: Strength & Muscle Tone: within normal limits Gait & Station: normal Patient leans: N/A  Psychiatric Specialty Exam: Physical Exam  ROS  Blood pressure 142/103, pulse 118, temperature 97.5 F (36.4 C), temperature source Oral, resp. rate 20, height 5\' 4"  (1.626 m), weight 37.195 kg (82 lb), SpO2 94 %.Body mass index is 14.07 kg/(m^2).  General Appearance: Fairly Groomed  Engineer, water::  Good  Speech:  Normal Rate  Volume:  Normal  Mood:  Fine I'm ready to go  Affect:  Flat  Thought Process:  Concrete but responds appropriately to questions  Orientation:  Full (Time, Place, and Person)  Thought Content:  Negative  Suicidal Thoughts:  No  Homicidal Thoughts:  No  Memory:  Immediate;   Fair Recent;   Fair Remote;   Fair  Judgement:  Fair  Insight:  Fair  Psychomotor Activity:  Negative  Concentration:  Fair  Recall:  [employer] of Knowledge:Fair  Language: Good  Akathisia:  Negative  Handed:  Right  AIMS (if indicated):     Assets:  Resilience Others:  The patient has been homeless, however he does have some social supports in the community per his caseworker.  ADL's:  Intact  Cognition: WNL   Sleep:  Number of Hours: 8   Have you used any form of tobacco in the last 30 days? (Cigarettes, Smokeless Tobacco, Cigars, and/or Pipes): Yes  Has this patient used any form of tobacco in the last 30 days? (Cigarettes, Smokeless Tobacco, Cigars, and/or Pipes) No  Past Medical History: History reviewed. No pertinent past medical history. History reviewed. No pertinent past surgical history. Family History: History reviewed. No pertinent family history. Social History:  History  Alcohol Use  . 2.4 - 3.0 oz/week  . 4-5 Cans of beer per week     History  Drug Use No    History   Social History  . Marital Status: Single    Spouse Name: N/A  . Number of Children: N/A  . Years of Education: N/A   Social History Main Topics  . Smoking status: Current Some Day Smoker -- 1.00 packs/day for 30 years    Last Attempt to Quit: 12/25/2014  . Smokeless tobacco: Not on file  . Alcohol Use: 2.4 - 3.0 oz/week    4-5 Cans of beer per week  . Drug Use: No  . Sexual Activity: Not Currently   Other Topics Concern  . None   Social History Narrative    Past Psychiatric History: Hospitalizations:  Outpatient Care:  Substance Abuse Care:  Self-Mutilation:  Suicidal Attempts:  Violent Behaviors:   Risk to Self: Is patient at risk for suicide?: No What has been your use of drugs/alcohol within the last 12 months?: uses alcohol every day Risk to Others:  Prior Inpatient Therapy:   Prior Outpatient Therapy:    Level of Care:  OP  Hospital Course:  Patient was admitted he was started on Zyprexa. He tolerated the medication well. He's exhibited no disruptive or aggressive behavior during the admission. He's been calm. He somewhat stayed to himself. There is been no signs of active psychosis. He has forward thinking wanting to leave then resumed his life. His case has been discussed with his outpatient caseworker is indicated he is generally pretty resourceful and does have from time to time  people he can stay within the community.  Consults:  None  Significant Diagnostic Studies:  None  Discharge Vitals:   Blood pressure 142/103, pulse 118, temperature 97.5 F (36.4 C), temperature source Oral, resp. rate 20, height 5\' 4"  (1.626 m), weight 37.195 kg (82 lb), SpO2 94 %. Body mass index is 14.07 kg/(m^2). Lab Results:   No results found for this or any previous visit (from the past 72 hour(s)).  Physical Findings: AIMS: Facial and Oral Movements Muscles of Facial Expression: None, normal Lips and Perioral Area: None, normal Jaw: None, normal Tongue: None, normal,Extremity Movements Upper (arms, wrists, hands, fingers): None, normal Lower (legs, knees, ankles, toes): None, normal, Trunk Movements Neck, shoulders, hips: None, normal, Overall Severity Severity of abnormal movements (highest score from questions above): None, normal Incapacitation due to abnormal movements: None, normal Patient's awareness of abnormal movements (rate only patient's report): Aware, severe distress, Dental Status Current problems with teeth and/or dentures?: No Does patient usually wear dentures?: No  CIWA:  CIWA-Ar Total: 4 COWS:  COWS Total Score: 6   See Psychiatric Specialty Exam and Suicide Risk Assessment completed by Attending Physician prior to discharge.  Discharge destination:  Home  Is patient on multiple antipsychotic therapies at discharge:  No   Has Patient had three or more failed trials of antipsychotic monotherapy by history:  No    Recommended Plan for Multiple Antipsychotic Therapies: NA     Medication List    TAKE these medications      Indication   mirtazapine 30 MG tablet  Commonly known as:  REMERON  Take 1 tablet (30 mg total) by mouth at bedtime.   Indication:  Trouble Sleeping     OLANZapine 20 MG tablet  Commonly known as:  ZYPREXA  Take 1 tablet (20 mg total) by mouth at bedtime.   Indication:  Schizophrenia           Follow-up  Information    Follow up with [facility]. Go in 4 days.   Why:  For follow-up care; walkin appts Monday 01/03/15 at [place] information:   [place], [place] Ph [phone] Fax [phone]      Follow-up recommendations:  Other:  Outpatient program  Comments:    Total Discharge Time: 30 minutes.  Signed: Faith Rogue 12/31/2014, 10:56 AM

## 2015-12-20 IMAGING — CT CT ABD-PELV W/O CM
2 of 8 series · 14 of 46 positions shown, 19 images · non-contrast
Comparison: None.

CLINICAL DATA: Abdominal pain

EXAM:
CT ABDOMEN AND PELVIS WITHOUT CONTRAST
TECHNIQUE: Multidetector CT imaging of the abdomen and pelvis was performed
following the standard protocol without IV contrast.

[Series 2: routine abd pel without · axial · non-contrast · 0.55mm/px · z∈[-412,-72]mm · 11 of 80 slices shown, 16 images]
[im 6/80  soft-tissue]
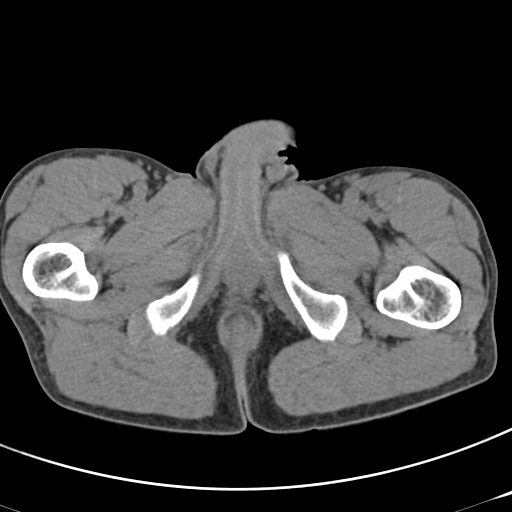
[im 6/80  bone]
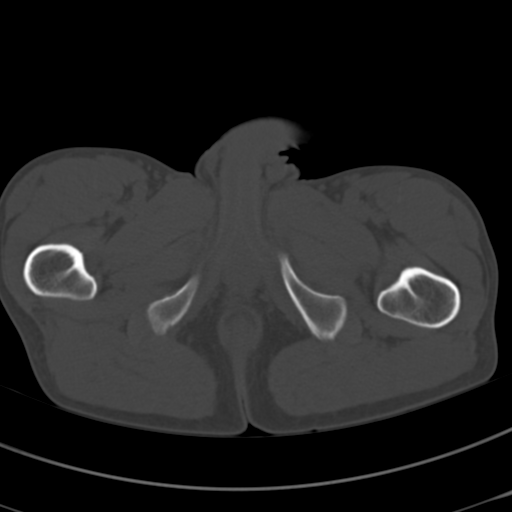
[im 16/80  soft-tissue]
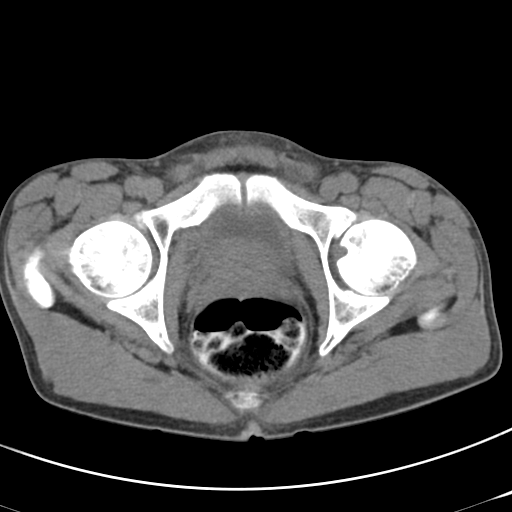
[im 22/80  soft-tissue]
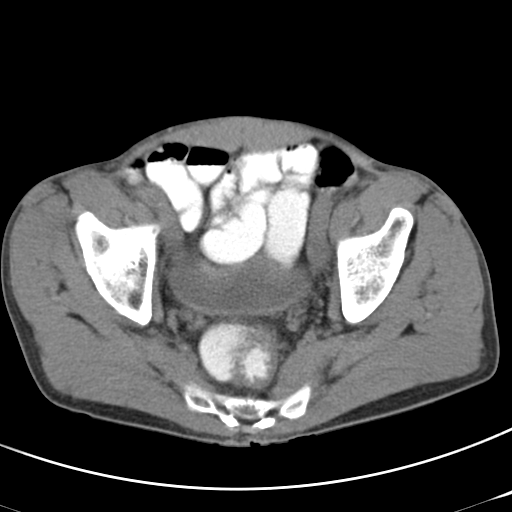
[im 27/80  soft-tissue]
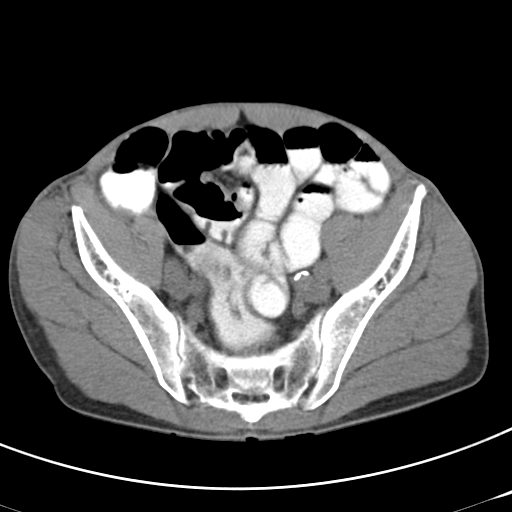
[im 37/80  soft-tissue]
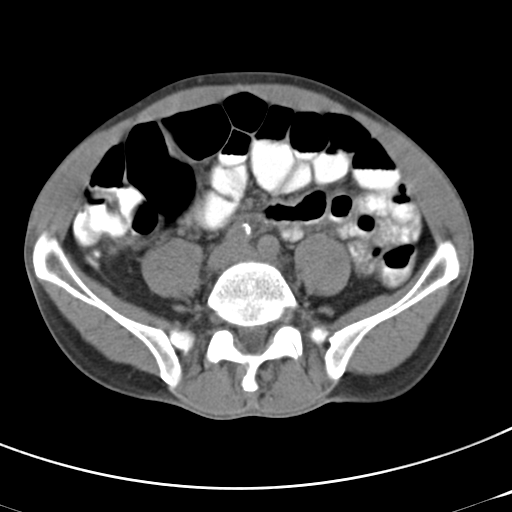
[im 43/80  soft-tissue]
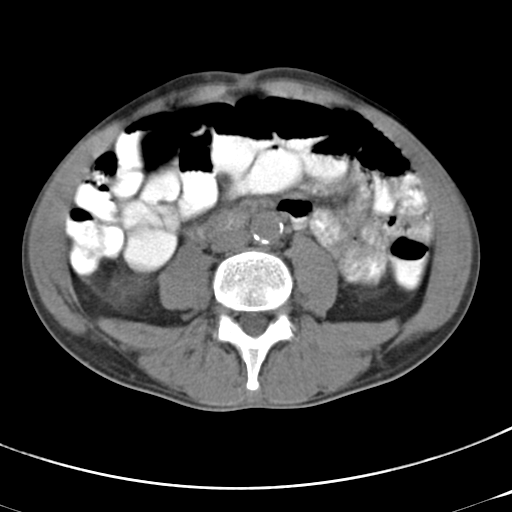
[im 53/80  soft-tissue]
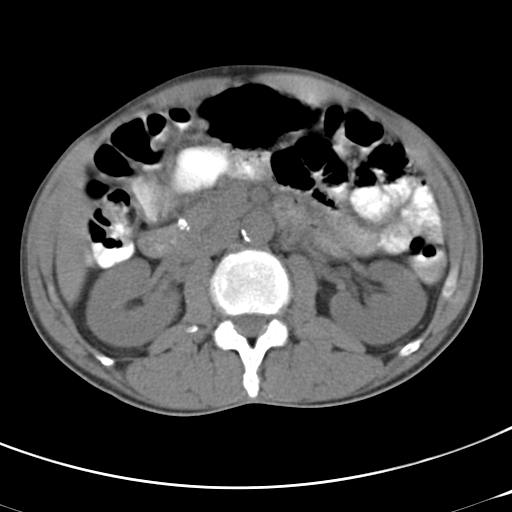
[im 58/80  soft-tissue]
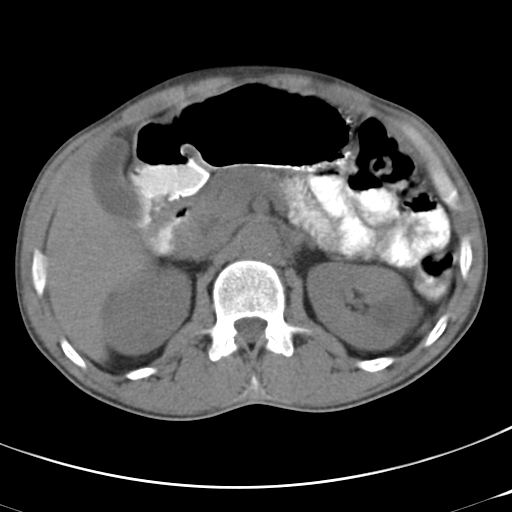
[im 58/80  lung]
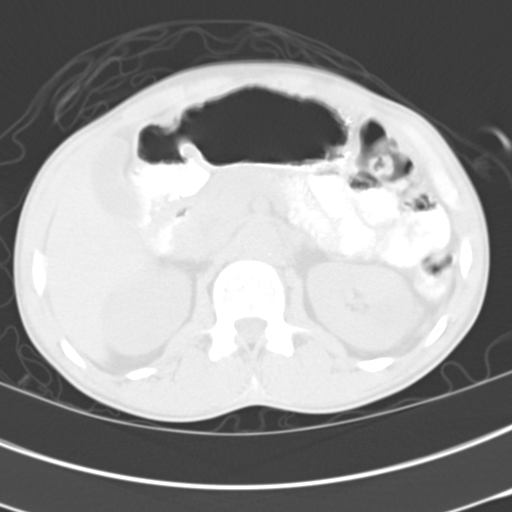
[im 64/80  soft-tissue]
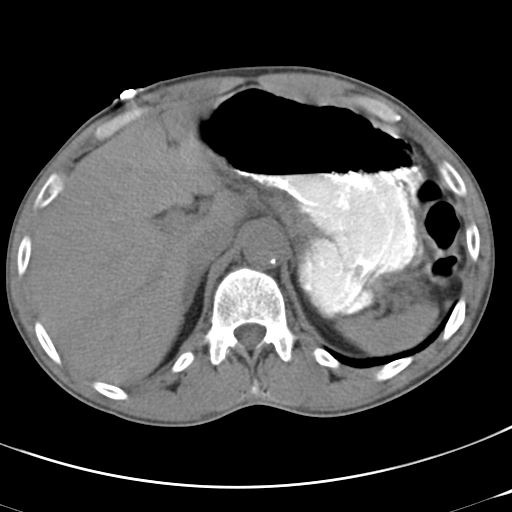
[im 64/80  lung]
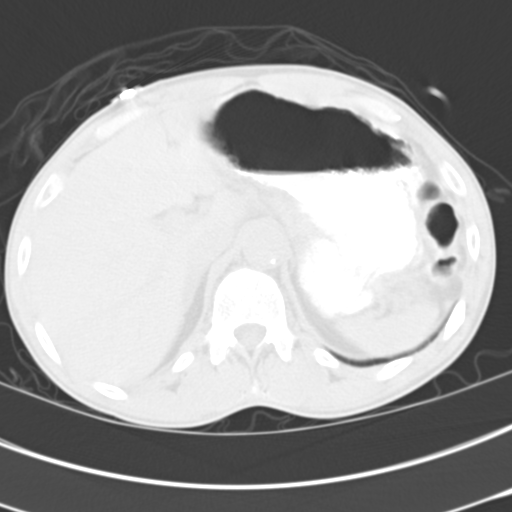
[im 64/80  bone]
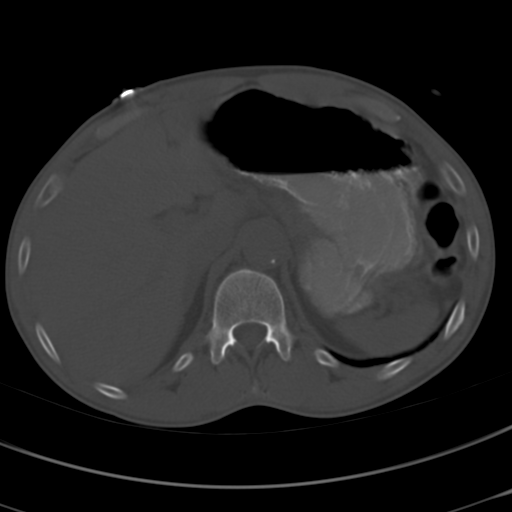
[im 69/80  lung]
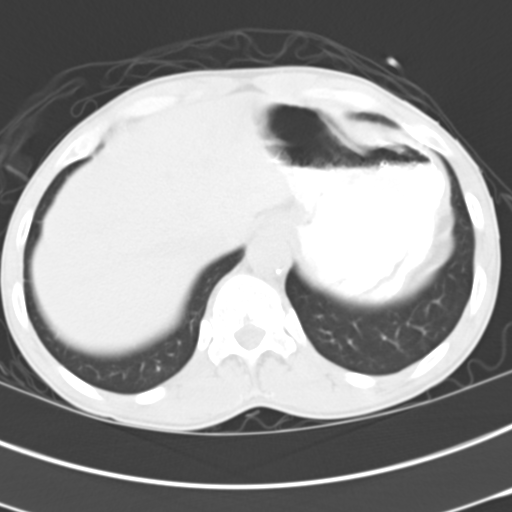
[im 74/80  soft-tissue]
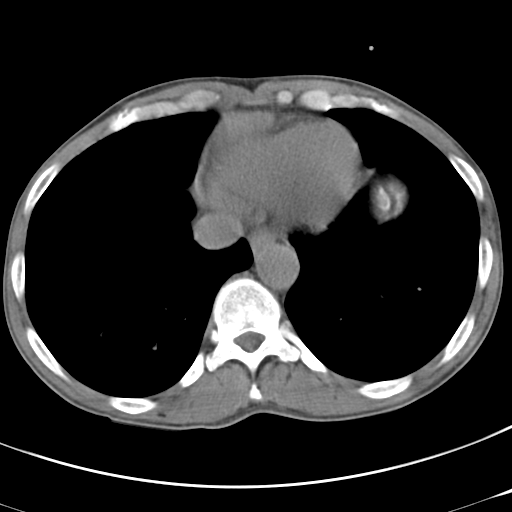
[im 74/80  lung]
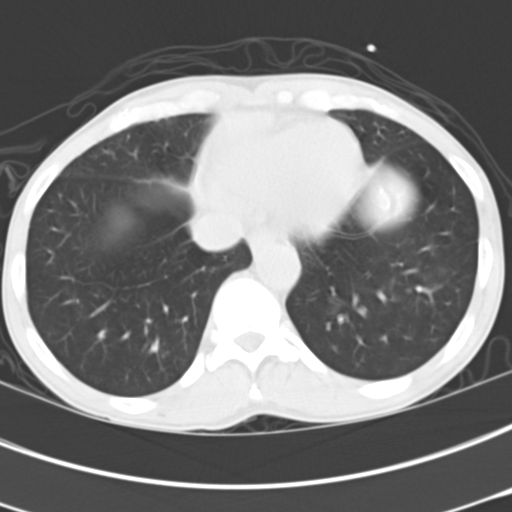

[Series 5: cor routine abd pel wo · coronal · 0.82mm/px · 3 of 112 slices shown]
[im 28/112  soft-tissue]
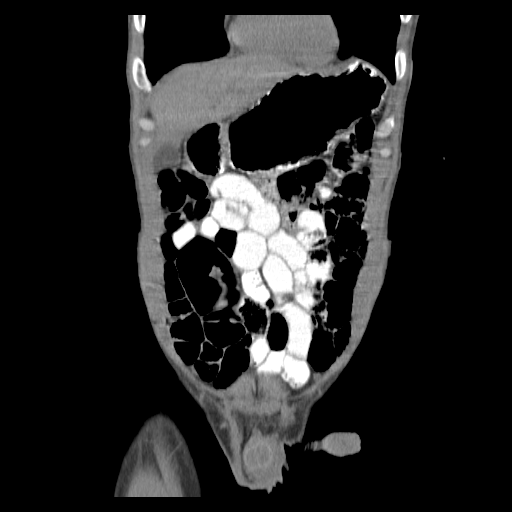
[im 56/112  soft-tissue]
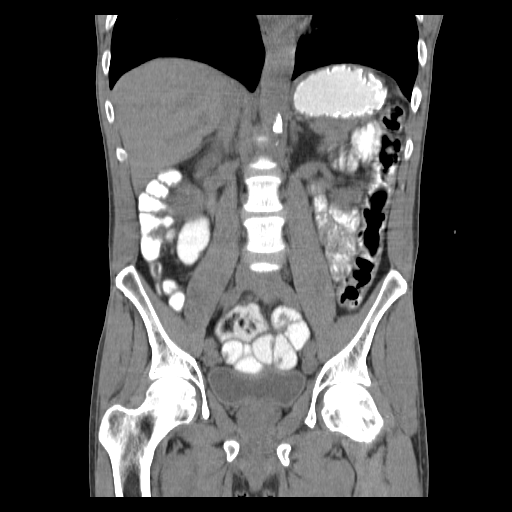
[im 84/112  soft-tissue]
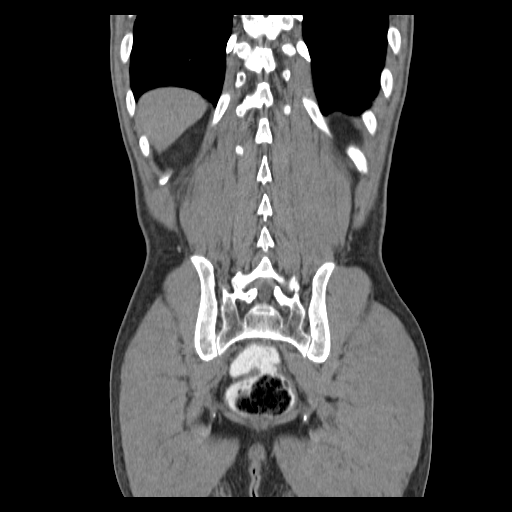

[14 of 46 positions shown; findings below may reference images not displayed]

FINDINGS: BODY WALL: Unremarkable.

LOWER CHEST: Unremarkable.

ABDOMEN/PELVIS:

Liver: 7 mm low-density in the central left hepatic lobe is likely
incidental cyst.

Biliary: No evidence of biliary obstruction or stone.

Pancreas: Indeterminate coarse calcifications near the pancreatic
head. The more lateral in coarse calcification is favored nodal.
Pancreatic head calcification appears separate from the pancreatic
or biliary tree. No evidence of active inflammation.

Spleen: Unremarkable.

Adrenals: Unremarkable.

Kidneys and ureters: No hydronephrosis or stone. 2.8 cm presumed
cyst in the upper pole left kidney.

Bladder: Unremarkable.

Reproductive: Unremarkable.

Bowel: The caliber of bowel is diffusely prominent, but there is no
transition point to suggest obstruction. No evidence of wall
thickening. Normal appendix.

Retroperitoneum: No mass or adenopathy.

Peritoneum: No ascites or pneumoperitoneum.

Vascular: Calcified atherosclerosis of the aorta and branch vessels.

OSSEOUS: No acute abnormalities.
IMPRESSION: No findings to explain abdominal pain.

## 2016-09-12 ENCOUNTER — Ambulatory Visit
Admission: EM | Admit: 2016-09-12 | Discharge: 2016-09-12 | Disposition: A | Discharge status: Home / Self Care | Payer: Medicaid Other | Attending: Family Medicine | Admitting: Family Medicine

## 2016-09-12 ENCOUNTER — Ambulatory Visit: Payer: Medicaid Other

## 2016-09-12 ENCOUNTER — Encounter: Payer: Self-pay | Admitting: *Deleted

## 2016-09-12 DIAGNOSIS — J449 Chronic obstructive pulmonary disease, unspecified: Secondary | ICD-10-CM | POA: Diagnosis not present

## 2016-09-12 DIAGNOSIS — F17200 Nicotine dependence, unspecified, uncomplicated: Secondary | ICD-10-CM | POA: Diagnosis not present

## 2016-09-12 DIAGNOSIS — J40 Bronchitis, not specified as acute or chronic: Secondary | ICD-10-CM | POA: Diagnosis not present

## 2016-09-12 DIAGNOSIS — R05 Cough: Secondary | ICD-10-CM | POA: Diagnosis present

## 2016-09-12 MED ORDER — PREDNISONE 10 MG PO TABS: ORAL_TABLET | ORAL | 0 refills | Status: DC

## 2016-09-12 MED ORDER — BENZONATATE 100 MG PO CAPS: 100.0000 mg | ORAL_CAPSULE | Freq: Three times a day (TID) | ORAL | 0 refills | Status: DC | PRN

## 2016-09-12 MED ORDER — AZITHROMYCIN 250 MG PO TABS: ORAL_TABLET | ORAL | 0 refills | Status: DC

## 2016-09-12 MED ORDER — ALBUTEROL SULFATE HFA 108 (90 BASE) MCG/ACT IN AERS: 2.0000 | INHALATION_SPRAY | RESPIRATORY_TRACT | 0 refills | Status: AC | PRN

## 2016-09-12 NOTE — ED Provider Notes (Signed)
[facility] ____________________________________________  Time seen: Approximately 1:43 PM  I have reviewed the triage vital signs and the nursing notes.   HISTORY  Chief Complaint Cough  HPI Nathan Mcintosh is a 61 y.o. male presenting for evaluation of cough present for the last 2-3 weeks. Patient reports cough is occasionally productive of yellowish to greenish mucus, but often hacking and nonproductive. Patient reports is continue to remain active. Denies fevers. Denies nasal congestion, sinus pressure, ear discomfort or sore throat. Reports otherwise feels well. Reports has not been taking any over-the-counter medications for the same complaints. Reports has continued to eat and drink consistent with his normal.  Denies chest pain, chest pain with deep breath, shortness of breath, abdominal pain, dysuria, extremity pain, extremity swelling or rash. Denies recent weight loss. Reports drinks approximately one beer a day, denies frequent intoxication. Denies drug use. Reports chronic smoker denies known history of COPD, emphysema, asthma or respiratory issues. Denies cardiac history. Denies renal insufficiency. Denies recent sickness. Denies recent antibiotic use.    History reviewed. No pertinent past medical history.  Patient Active Problem List   Diagnosis Date Noted  . Severe recurrent major depression with psychotic features ([place]) 12/25/2014  . Severe major depression with psychotic features ([place]) 12/24/2014  . Alcohol abuse 12/24/2014  . Malnutrition ([place]) 12/24/2014    History reviewed. No pertinent surgical history.   No current facility-administered medications for this encounter.   Current Outpatient Prescriptions:  .  albuterol (PROVENTIL HFA;VENTOLIN HFA) 108 (90 Base) MCG/ACT inhaler, Inhale 2 puffs into the lungs every 4 (four) hours as needed for wheezing., Disp: 1 Inhaler, Rfl: 0 .  azithromycin (ZITHROMAX Z-PAK) 250 MG tablet, Take 2 tablets (500  mg) on  Day 1,  followed by 1 tablet (250 mg) once daily on Days 2 through 5., Disp: 6 each, Rfl: 0 .  benzonatate (TESSALON PERLES) 100 MG capsule, Take 1 capsule (100 mg total) by mouth 3 (three) times daily as needed., Disp: 15 capsule, Rfl: 0 .  predniSONE (DELTASONE) 10 MG tablet, Start 60 mg po day one, then 50 mg po day two, taper by 10 mg daily until complete., Disp: 21 tablet, Rfl: 0  Allergies Patient has no known allergies.  History reviewed. No pertinent family history.  Social History Social History  Substance Use Topics  . Smoking status: Current Some Day Smoker    Packs/day: 1.00    Years: 30.00    Last attempt to quit: 12/25/2014  . Smokeless tobacco: Not on file  . Alcohol use 2.4 - 3.0 oz/week    4 - 5 Cans of beer per week    Review of Systems Constitutional: No fever/chills Eyes: No visual changes. ENT: No sore throat. Cardiovascular: Denies chest pain. Respiratory: Denies shortness of breath. Gastrointestinal: No abdominal pain.  No nausea, no vomiting.  No diarrhea.  No constipation. Genitourinary: Negative for dysuria. Musculoskeletal: Negative for back pain. Skin: Negative for rash. Neurological: Negative for headaches, focal weakness or numbness.  10-point ROS otherwise negative.  ____________________________________________   PHYSICAL EXAM:  VITAL SIGNS: ED Triage Vitals  Enc Vitals Group     BP 09/12/16 1310 114/89     Pulse Rate 09/12/16 1310 (!) 113     Resp 09/12/16 1310 16     Temp 09/12/16 1310 98.2 F (36.8 C)     Temp Source 09/12/16 1310 Oral     SpO2 09/12/16 1310 98 %     Weight 09/12/16 1312 120 lb (54.4 kg)  Height 09/12/16 1312 5\' 4"  (1.626 m)     Head Circumference --      Peak Flow --      Pain Score --      Pain Loc --      Pain Edu? --      Excl. in [place]? --    Vitals:   09/12/16 1310 09/12/16 1312 09/12/16 1431  BP: 114/89  120/89  Pulse: (!) 113  (!) 101  Resp: 16  16  Temp: 98.2 F (36.8 C)  98.2 F (36.8  C)  TempSrc: Oral  Oral  SpO2: 98%  99%  Weight:  120 lb (54.4 kg)   Height:  5\' 4"  (1.626 m)     Constitutional: Alert and oriented. Well appearing and in no acute distress. Eyes: Conjunctivae are normal. PERRL. EOMI. Head: Atraumatic. No sinus tenderness to palpation. No swelling. No erythema.  Ears: no erythema, normal TMs bilaterally.   Nose:No nasal congestion or rhinorrhea  Mouth/Throat: Mucous membranes are moist. No pharyngeal erythema. No tonsillar swelling or exudate. Widespread dental decay. Neck: No stridor.  No cervical spine tenderness to palpation. Hematological/Lymphatic/Immunilogical: No cervical lymphadenopathy. Cardiovascular: Normal rate, regular rhythm. Grossly normal heart sounds.  Good peripheral circulation. Respiratory: Normal respiratory effort.  No retractions. No rhonchi. Minimal scattered wheezing. Dry intermittent cough noted in room with mild bronchospasm. Speaks in complete sentences. Good air movement.  Gastrointestinal: Soft and nontender.  Musculoskeletal: Ambulatory with steady gait. No cervical, thoracic or lumbar tenderness to palpation. Bilateral lower extremities no edema noted and nontender. Neurologic:  Normal speech and language. No gait instability. Skin:  Skin appears warm, dry Psychiatric: Mood and affect are normal. Speech and behavior are normal.   ___________________________________________   LABS (all labs ordered are listed, but only abnormal results are displayed)  Labs Reviewed - No data to display  RADIOLOGY  Dg Chest 2 View  Result Date: 09/12/2016 CLINICAL DATA:  Duct of cough and chest congestion for the past 2-3 days. Current long-term smoker. EXAM: CHEST  2 VIEW COMPARISON:  Portable chest x-ray of February 11, 2014 FINDINGS: The lungs are mildly hyperinflated but clear. The heart and pulmonary vascularity are normal. The mediastinum is normal in width. There is no pleural effusion. The bony thorax exhibits no acute  abnormality. IMPRESSION: Mild hyperinflation consistent with the patient's smoking history and COPD. No pneumonia, CHF, nor other acute cardiopulmonary abnormality. Electronically Signed   By: David  Martinique M.D.   On: 09/12/2016 13:56   ____________________________________________   PROCEDURES Procedures    INITIAL IMPRESSION / ASSESSMENT AND PLAN / ED COURSE  Pertinent labs & imaging results that were available during my care of the patient were reviewed by me and considered in my medical decision making (see chart for details).  Very well-appearing patient. No acute distress. Present for cough has been present for the last 2-3 weeks. Denies other complaints. Reports otherwise feels well. Denies fevers. Chronic smoker. Chest x-ray per radiologist's mild hyperinflation consistent with patient's smoking history and COPD; no pneumonia, CHF nor other acute cardiac pulmonary abnormality. Discussed in detail with patient. Suspect bronchitis and COPD, will treat patient with oral azithromycin, prednisone taper, when necessary Tessalon Perles and when necessary albuterol inhaler. Encouraged patient to follow-up with his primary care physician. Encourage rest, fluids and supportive care.  Discussed follow up with Primary care physician this week. Discussed follow up and return parameters including no resolution or any worsening concerns. Patient verbalized understanding and agreed to plan.   ____________________________________________  FINAL CLINICAL IMPRESSION(S) / ED DIAGNOSES  Final diagnoses:  Bronchitis     Discharge Medication List as of 09/12/2016  2:16 PM    START taking these medications   Details  albuterol (PROVENTIL HFA;VENTOLIN HFA) 108 (90 Base) MCG/ACT inhaler Inhale 2 puffs into the lungs every 4 (four) hours as needed for wheezing., Starting Wed 09/12/2016, Normal    azithromycin (ZITHROMAX Z-PAK) 250 MG tablet Take 2 tablets (500 mg) on  Day 1,  followed by 1 tablet (250 mg)  once daily on Days 2 through 5., Normal    benzonatate (TESSALON PERLES) 100 MG capsule Take 1 capsule (100 mg total) by mouth 3 (three) times daily as needed., Starting Wed 09/12/2016, Normal    predniSONE (DELTASONE) 10 MG tablet Start 60 mg po day one, then 50 mg po day two, taper by 10 mg daily until complete., Normal        Note: This dictation was prepared with Dragon dictation along with smaller phrase technology. Any transcriptional errors that result from this process are unintentional.        Marylene Land, NP 09/12/16 1901

## 2016-09-12 NOTE — Discharge Instructions (Signed)
Take medication as prescribed. Rest. Drink plenty of fluids.   Follow up with your primary care physician this week. Return to Urgent care or Emergency room for new or worsening concerns.

## 2016-09-12 NOTE — ED Triage Notes (Signed)
Productive cough- yellow for more than a week. Pt denies other symptoms.

## 2017-05-06 ENCOUNTER — Encounter: Payer: Self-pay | Admitting: *Deleted

## 2017-05-06 ENCOUNTER — Emergency Department: Payer: Medicaid Other

## 2017-05-06 ENCOUNTER — Inpatient Hospital Stay
Admission: EM | Admit: 2017-05-06 | Discharge: 2017-05-08 | DRG: 871 | Disposition: A | Discharge status: Home / Self Care | Payer: Medicaid Other | Attending: Internal Medicine | Admitting: Internal Medicine

## 2017-05-06 DIAGNOSIS — J189 Pneumonia, unspecified organism: Secondary | ICD-10-CM | POA: Diagnosis present

## 2017-05-06 DIAGNOSIS — F17200 Nicotine dependence, unspecified, uncomplicated: Secondary | ICD-10-CM | POA: Diagnosis present

## 2017-05-06 DIAGNOSIS — Z716 Tobacco abuse counseling: Secondary | ICD-10-CM | POA: Diagnosis not present

## 2017-05-06 DIAGNOSIS — Z79899 Other long term (current) drug therapy: Secondary | ICD-10-CM

## 2017-05-06 DIAGNOSIS — I10 Essential (primary) hypertension: Secondary | ICD-10-CM | POA: Diagnosis present

## 2017-05-06 DIAGNOSIS — Z590 Homelessness: Secondary | ICD-10-CM | POA: Diagnosis not present

## 2017-05-06 DIAGNOSIS — A419 Sepsis, unspecified organism: Principal | ICD-10-CM | POA: Diagnosis present

## 2017-05-06 DIAGNOSIS — J181 Lobar pneumonia, unspecified organism: Secondary | ICD-10-CM | POA: Diagnosis present

## 2017-05-06 LAB — BASIC METABOLIC PANEL
Anion gap: 10 (ref 5–15)
BUN: 22 mg/dL — ABNORMAL HIGH (ref 6–20)
CALCIUM: 9 mg/dL (ref 8.9–10.3)
CHLORIDE: 96 mmol/L — AB (ref 101–111)
CO2: 25 mmol/L (ref 22–32)
CREATININE: 1.03 mg/dL (ref 0.61–1.24)
GFR calc Af Amer: >60 mL/min (ref >60)
GFR calc non Af Amer: >60 mL/min (ref >60)
Glucose, Bld: 109 mg/dL — ABNORMAL HIGH (ref 65–99)
Potassium: 4.4 mmol/L (ref 3.5–5.1)
Sodium: 131 mmol/L — ABNORMAL LOW (ref 135–145)

## 2017-05-06 LAB — BRAIN NATRIURETIC PEPTIDE: B Natriuretic Peptide: 372 pg/mL — ABNORMAL HIGH (ref 0.0–100.0)

## 2017-05-06 LAB — HEPATIC FUNCTION PANEL
ALBUMIN: 2.6 g/dL — AB (ref 3.5–5.0)
ALT: 18 U/L (ref 17–63)
AST: 24 U/L (ref 15–41)
Alkaline Phosphatase: 58 U/L (ref 38–126)
BILIRUBIN DIRECT: 0.4 mg/dL (ref 0.1–0.5)
Indirect Bilirubin: 0.9 mg/dL (ref 0.3–0.9)
Total Bilirubin: 1.3 mg/dL — ABNORMAL HIGH (ref 0.3–1.2)
Total Protein: 7.2 g/dL (ref 6.5–8.1)

## 2017-05-06 LAB — LACTIC ACID, PLASMA
Lactic Acid, Venous: 2.3 mmol/L (ref 0.5–1.9)
Lactic Acid, Venous: 2.3 mmol/L (ref 0.5–1.9)

## 2017-05-06 LAB — CBC WITH DIFFERENTIAL/PLATELET
Basophils Absolute: 0.1 10*3/uL (ref 0–0.1)
Basophils Relative: 1 %
Eosinophils Absolute: 0 10*3/uL (ref 0–0.7)
Eosinophils Relative: 0 %
HEMATOCRIT: 35.2 % — AB (ref 40.0–52.0)
HEMOGLOBIN: 11.2 g/dL — AB (ref 13.0–18.0)
LYMPHS ABS: 1.2 10*3/uL (ref 1.0–3.6)
LYMPHS PCT: 9 %
MCH: 24.6 pg — AB (ref 26.0–34.0)
MCHC: 31.7 g/dL — AB (ref 32.0–36.0)
MCV: 77.6 fL — AB (ref 80.0–100.0)
MONOS PCT: 6 %
Monocytes Absolute: 0.8 10*3/uL (ref 0.2–1.0)
NEUTROS ABS: 11.4 10*3/uL — AB (ref 1.4–6.5)
NEUTROS PCT: 84 %
Platelets: 288 10*3/uL (ref 150–440)
RBC: 4.53 MIL/uL (ref 4.40–5.90)
RDW: 20.4 % — ABNORMAL HIGH (ref 11.5–14.5)
WBC: 13.5 10*3/uL — ABNORMAL HIGH (ref 3.8–10.6)

## 2017-05-06 LAB — TROPONIN I: Troponin I: 0.03 ng/mL (ref <0.03)

## 2017-05-06 LAB — ETHANOL: Alcohol, Ethyl (B): <10 mg/dL (ref <10)

## 2017-05-06 MED ORDER — GUAIFENESIN-DM 100-10 MG/5ML PO SYRP
5.0000 mL | ORAL_SOLUTION | ORAL | Status: DC | PRN
  Administered 2017-05-06 – 2017-05-08 (×3): 5 mL via ORAL
  Filled 2017-05-06 (×3): qty 5

## 2017-05-06 MED ORDER — ACETAMINOPHEN 650 MG RE SUPP: 650.0000 mg | Freq: Four times a day (QID) | RECTAL | Status: DC | PRN

## 2017-05-06 MED ORDER — ACETAMINOPHEN 325 MG PO TABS: 650.0000 mg | ORAL_TABLET | Freq: Four times a day (QID) | ORAL | Status: DC | PRN

## 2017-05-06 MED ORDER — CEFTRIAXONE SODIUM IN DEXTROSE 20 MG/ML IV SOLN: 1.0000 g | Freq: Once | INTRAVENOUS | Status: DC

## 2017-05-06 MED ORDER — POLYETHYLENE GLYCOL 3350 17 G PO PACK: 17.0000 g | PACK | Freq: Every day | ORAL | Status: DC | PRN

## 2017-05-06 MED ORDER — CEFTRIAXONE SODIUM IN DEXTROSE 20 MG/ML IV SOLN
INTRAVENOUS | Status: AC
  Filled 2017-05-06: qty 50

## 2017-05-06 MED ORDER — HYDROCODONE-ACETAMINOPHEN 5-325 MG PO TABS: 1.0000 | ORAL_TABLET | ORAL | Status: DC | PRN

## 2017-05-06 MED ORDER — IPRATROPIUM-ALBUTEROL 0.5-2.5 (3) MG/3ML IN SOLN
3.0000 mL | Freq: Once | RESPIRATORY_TRACT | Status: AC
  Administered 2017-05-06: 3 mL via RESPIRATORY_TRACT
  Filled 2017-05-06: qty 3

## 2017-05-06 MED ORDER — LEVOFLOXACIN IN D5W 750 MG/150ML IV SOLN
750.0000 mg | INTRAVENOUS | Status: DC
  Administered 2017-05-07: 750 mg via INTRAVENOUS
  Filled 2017-05-06 (×2): qty 150

## 2017-05-06 MED ORDER — SODIUM CHLORIDE 0.9 % IV BOLUS (SEPSIS)
1000.0000 mL | Freq: Once | INTRAVENOUS | Status: AC
  Administered 2017-05-06: 1000 mL via INTRAVENOUS

## 2017-05-06 MED ORDER — ENOXAPARIN SODIUM 40 MG/0.4ML ~~LOC~~ SOLN
40.0000 mg | SUBCUTANEOUS | Status: DC
  Administered 2017-05-06 – 2017-05-07 (×2): 40 mg via SUBCUTANEOUS
  Filled 2017-05-06 (×2): qty 0.4

## 2017-05-06 MED ORDER — LEVOFLOXACIN IN D5W 750 MG/150ML IV SOLN
750.0000 mg | Freq: Once | INTRAVENOUS | Status: AC
  Administered 2017-05-06: 750 mg via INTRAVENOUS
  Filled 2017-05-06: qty 150

## 2017-05-06 MED ORDER — SODIUM CHLORIDE 0.9 % IV SOLN
INTRAVENOUS | Status: DC
  Administered 2017-05-06 – 2017-05-08 (×3): via INTRAVENOUS

## 2017-05-06 MED ORDER — DEXTROSE 5 % IV SOLN: 500.0000 mg | Freq: Once | INTRAVENOUS | Status: DC

## 2017-05-06 MED ORDER — INFLUENZA VAC SPLIT QUAD 0.5 ML IM SUSY: 0.5000 mL | PREFILLED_SYRINGE | INTRAMUSCULAR | Status: DC

## 2017-05-06 MED ORDER — ONDANSETRON HCL 4 MG PO TABS: 4.0000 mg | ORAL_TABLET | Freq: Four times a day (QID) | ORAL | Status: DC | PRN

## 2017-05-06 MED ORDER — BISACODYL 5 MG PO TBEC: 5.0000 mg | DELAYED_RELEASE_TABLET | Freq: Every day | ORAL | Status: DC | PRN

## 2017-05-06 MED ORDER — SODIUM CHLORIDE 0.9 % IV BOLUS (SEPSIS): 1000.0000 mL | Freq: Once | INTRAVENOUS | Status: DC

## 2017-05-06 MED ORDER — ONDANSETRON HCL 4 MG/2ML IJ SOLN: 4.0000 mg | Freq: Four times a day (QID) | INTRAMUSCULAR | Status: DC | PRN

## 2017-05-06 NOTE — ED Notes (Signed)
Date and time results received: 05/06/17 1900 (use smartphrase ".now" to insert current time)  Test: troponin, lactic acid Critical Value:0.03, 2.3  Name of Provider Notified: ED  Orders Received? Or Actions Taken?: admission

## 2017-05-06 NOTE — ED Provider Notes (Signed)
[facility] Emergency Department Provider Note  ____________________________________________   First MD Initiated Contact with Patient 05/06/17 1724     (approximate)  I have reviewed the triage vital signs and the nursing notes.   HISTORY  Chief Complaint No chief complaint on file.  Level 5 exemption history limited by the patient's clinical condition  HPI Nathan Mcintosh is a 61 y.o. male who comes to the emergency department via EMS with cough, shortness of breath, and chest pain for the past several days.  The patient is homeless and is a very difficult historian.  History reviewed. No pertinent past medical history.  Patient Active Problem List   Diagnosis Date Noted  . Severe recurrent major depression with psychotic features ([place]) 12/25/2014  . Severe major depression with psychotic features ([place]) 12/24/2014  . Alcohol abuse 12/24/2014  . Malnutrition ([place]) 12/24/2014    History reviewed. No pertinent surgical history.  Prior to Admission medications   Medication Sig Start Date End Date Taking? Authorizing Provider  albuterol (PROVENTIL HFA;VENTOLIN HFA) 108 (90 Base) MCG/ACT inhaler Inhale 2 puffs into the lungs every 4 (four) hours as needed for wheezing. 09/12/16   Marylene Land, NP  azithromycin (ZITHROMAX Z-PAK) 250 MG tablet Take 2 tablets (500 mg) on  Day 1,  followed by 1 tablet (250 mg) once daily on Days 2 through 5. 09/12/16   Marylene Land, NP  benzonatate (TESSALON PERLES) 100 MG capsule Take 1 capsule (100 mg total) by mouth 3 (three) times daily as needed. 09/12/16   Marylene Land, NP  mirtazapine (REMERON) 30 MG tablet Take 1 tablet (30 mg total) by mouth at bedtime. 12/31/14   Marjie Skiff, MD  OLANZapine (ZYPREXA) 20 MG tablet Take 1 tablet (20 mg total) by mouth at bedtime. 12/31/14   Marjie Skiff, MD  predniSONE (DELTASONE) 10 MG tablet Start 60 mg po day one, then 50 mg po day two, taper by 10 mg daily until  complete. 09/12/16   Marylene Land, NP    Allergies Patient has no known allergies.  No family history on file.  Social History Social History  Substance Use Topics  . Smoking status: Current Some Day Smoker    Packs/day: 1.00    Years: 30.00    Last attempt to quit: 12/25/2014  . Smokeless tobacco: Never Used  . Alcohol use 2.4 - 3.0 oz/week    4 - 5 Cans of beer per week    Review of Systems Level 5 exemption history limited by the patient's clinical condition  ____________________________________________   PHYSICAL EXAM:  VITAL SIGNS: ED Triage Vitals  Enc Vitals Group     BP      Pulse      Resp      Temp      Temp src      SpO2      Weight      Height      Head Circumference      Peak Flow      Pain Score      Pain Loc      Pain Edu?      Excl. in [place]?     Constitutional: Disheveled malodorous confused no obvious respiratory distress Eyes: PERRL EOMI. Head: Atraumatic. Nose: No congestion/rhinnorhea. Mouth/Throat: No trismus Neck: No stridor.   Cardiovascular: Tachycardic rate, regular rhythm. Grossly normal heart sounds.  Good peripheral circulation. Respiratory: Increased respiratory effort rhonchi throughout most prominently on the left moving good air  Gastrointestinal: Soft nontender Musculoske No gross focal neurologic deficits are appreciated. Skin:  Skin is warm, dry and intact. No rash noted. Psychiatric: Appears somewhat sleepy and confused    ____________________________________________   DIFFERENTIAL includes but not limited to  Acute coronary syndrome, pneumothorax, pulmonary embolism, pneumonia, sepsis, metabolic derangement ____________________________________________   LABS (all labs ordered are listed, but only abnormal results are displayed)  Labs Reviewed  CULTURE, BLOOD (ROUTINE X 2)  CULTURE, BLOOD (ROUTINE X 2)  BASIC METABOLIC PANEL  ETHANOL  HEPATIC FUNCTION PANEL  TROPONIN I  CBC WITH DIFFERENTIAL/PLATELET    URINALYSIS, COMPLETE (UACMP) WITH MICROSCOPIC  BRAIN NATRIURETIC PEPTIDE  LACTIC ACID, PLASMA  LACTIC ACID, PLASMA    Blood work reviewed and interpreted by me shows elevated white count concerning for bacterial infection __________________________________________  EKG  ED ECG REPORT I, Darel Hong, the attending physician, personally viewed and interpreted this ECG.  Date: 05/06/2017 EKG Time:  Rate: 114 Rhythm: Sinus tachycardia QRS Axis: normal Intervals: normal ST/T Wave abnormalities: normal Narrative Interpretation: Large amount of artifact makes interpretation difficult but this does appear to be sinus tachycardia with large voltages and no obvious ischemia _____________________________________  RADIOLOGY  Chest x-ray reviewed by me concerning for left-sided pneumonia ____________________________________________   PROCEDURES  Procedure(s) performed: no  Procedures  Critical Care performed: no  Observation: no ____________________________________________   INITIAL IMPRESSION / ASSESSMENT AND PLAN / ED COURSE  Pertinent labs & imaging results that were available during my care of the patient were reviewed by me and considered in my medical decision making (see chart for details).  Patient arrives short of breath tachycardic with rhonchorous breath sounds.  Differential is broad in this poor historian but x-ray and blood work are pending.    ----------------------------------------- 5:48 PM on 05/06/2017 -----------------------------------------  The patient's chest x-ray is concerning for left sided pneumonia.  This would be community-acquired.  We will treat him with IV fluids as well as IV ceftriaxone and azithromycin now and reevaluate. ___________________________________________  ----------------------------------------- 6:43 PM on 05/06/2017 -----------------------------------------  I discussed the case with the on-call hospitalist Dr.  Benjie Karvonen.  As the patient is hypoxic, tachycardic, with an elevated white count and a lobar pneumonia he requires inpatient admission for IV antibiotics and IV fluid resuscitation.  She has requested Levaquin instead of ceftriaxone and azithromycin.  Patient verbalizes understanding and agreement the plan._   FINAL CLINICAL IMPRESSION(S) / ED DIAGNOSES  Final diagnoses:  Community acquired pneumonia of left lung, unspecified part of lung      NEW MEDICATIONS STARTED DURING THIS VISIT:  New Prescriptions   No medications on file     Note:  This document was prepared using Dragon voice recognition software and may include unintentional dictation errors.     Darel Hong, MD 05/06/17 1857

## 2017-05-06 NOTE — ED Notes (Signed)
Report called to 2c RN Claiborne Billings

## 2017-05-06 NOTE — ED Triage Notes (Signed)
Comes in by [place]. [place] car [place]. Pt states chest pain x 3 days on and off. Is a poor historian. Disheveled appearance. Old clothes, states he hasnt eaten or smoked in 2 to 3 day,

## 2017-05-06 NOTE — H&P (Signed)
[facility] [place] NAME: Hiram Mciver    MR#:  [id number]  DATE OF BIRTH:  Jan 13, 1956  DATE OF ADMISSION:  05/06/2017  PRIMARY CARE PHYSICIAN: Patient, No Pcp Per   REQUESTING/REFERRING PHYSICIAN: dr Mable Paris  CHIEF COMPLAINT:   Sob and weakness HISTORY OF PRESENT ILLNESS:  Osmel Dykstra  is a 61 y.o. homeless male with no significant past medical history who presents to the emergency room due to feelings of shortness of breath and weakness. Patient is a very poor historian. He reports over the past 2 days he has had increasing shortness of breath and cough.  He doesn't provide much other information. He does state that he smokes but he grasps but he quit 2 days ago. He denies any EtOH use.  Chest x-ray performed in the emergency room shows pneumonia. He is started on Levaquin. He is also noted to be tachycardic and has a elevated white blood cell count.   PAST MEDICAL HISTORY:  None  PAST SURGICAL HISTORY:  None  SOCIAL HISTORY:   Social History  Substance Use Topics  . Smoking status: Current Some Day Smoker    Packs/day: 1.00    Years: 30.00    Last attempt to quit: 12/25/2014  . Smokeless tobacco: Never Used  . Alcohol use 2.4 - 3.0 oz/week    4 - 5 Cans of beer per week    FAMILY HISTORY:  Unknown to patient  DRUG ALLERGIES:  No Known Allergies  REVIEW OF SYSTEMS:   Review of Systems  Constitutional: Positive for chills and malaise/fatigue. Negative for fever.  HENT: Negative.  Negative for ear discharge, ear pain, hearing loss, nosebleeds and sore throat.   Eyes: Negative.  Negative for blurred vision and pain.  Respiratory: Positive for cough and shortness of breath. Negative for hemoptysis and wheezing.   Cardiovascular: Negative.  Negative for chest pain, palpitations and leg swelling.  Gastrointestinal: Negative.  Negative for abdominal pain, blood in stool, diarrhea, nausea and vomiting.   Genitourinary: Negative.  Negative for dysuria.  Musculoskeletal: Negative.  Negative for back pain.  Skin: Negative.   Neurological: Positive for weakness. Negative for dizziness, tremors, speech change, focal weakness, seizures and headaches.  Endo/Heme/Allergies: Negative.  Does not bruise/bleed easily.  Psychiatric/Behavioral: Negative.  Negative for depression, hallucinations and suicidal ideas.    MEDICATIONS AT HOME:   Prior to Admission medications   Medication Sig Start Date End Date Taking? Authorizing Provider  albuterol (PROVENTIL HFA;VENTOLIN HFA) 108 (90 Base) MCG/ACT inhaler Inhale 2 puffs into the lungs every 4 (four) hours as needed for wheezing. 09/12/16  Yes Marylene Land, NP  azithromycin (ZITHROMAX Z-PAK) 250 MG tablet Take 2 tablets (500 mg) on  Day 1,  followed by 1 tablet (250 mg) once daily on Days 2 through 5. Patient not taking: Reported on 05/06/2017 09/12/16   Marylene Land, NP  benzonatate (TESSALON PERLES) 100 MG capsule Take 1 capsule (100 mg total) by mouth 3 (three) times daily as needed. Patient not taking: Reported on 05/06/2017 09/12/16   Marylene Land, NP  mirtazapine (REMERON) 30 MG tablet Take 1 tablet (30 mg total) by mouth at bedtime. Patient not taking: Reported on 05/06/2017 12/31/14   Marjie Skiff, MD  OLANZapine (ZYPREXA) 20 MG tablet Take 1 tablet (20 mg total) by mouth at bedtime. Patient not taking: Reported on 05/06/2017 12/31/14   Marjie Skiff, MD  predniSONE (DELTASONE) 10 MG tablet Start 60 mg po day one,  then 50 mg po day two, taper by 10 mg daily until complete. Patient not taking: Reported on 05/06/2017 09/12/16   Marylene Land, NP      VITAL SIGNS:  Pulse (!) 114, temperature 99.1 F (37.3 C), temperature source Rectal, height 5\' 5"  (1.651 m), weight 52.2 kg (115 lb), SpO2 95 %.  PHYSICAL EXAMINATION:   Physical Exam  Constitutional: He is oriented to person, place, and time. No distress.  Very frail and thin  HENT:   Head: Normocephalic.  Eyes: No scleral icterus.  Neck: Normal range of motion. Neck supple. No JVD present. No tracheal deviation present.  Cardiovascular: Normal rate, regular rhythm and normal heart sounds.  Exam reveals no gallop and no friction rub.   No murmur heard. Pulmonary/Chest: Effort normal. No respiratory distress. He has no wheezes. He has no rales. He exhibits no tenderness.  Crackles at the left base with decreased sounds at the left base  Abdominal: Soft. Bowel sounds are normal. He exhibits no distension and no mass. There is no tenderness. There is no rebound and no guarding.  Musculoskeletal: Normal range of motion. He exhibits no edema.  Neurological: He is alert and oriented to person, place, and time.  Skin: Skin is dry. No rash noted. No erythema.  Dry scaly skin  Psychiatric: Affect and judgment normal.      LABORATORY PANEL:   CBC  Recent Labs Lab 05/06/17 1749  WBC 13.5*  HGB 11.2*  HCT 35.2*  PLT 288   ------------------------------------------------------------------------------------------------------------------  Chemistries  No results for input(s): NA, K, CL, CO2, GLUCOSE, BUN, CREATININE, CALCIUM, MG, AST, ALT, ALKPHOS, BILITOT in the last 168 hours.  Invalid input(s): GFRCGP ------------------------------------------------------------------------------------------------------------------  Cardiac Enzymes No results for input(s): TROPONINI in the last 168 hours. ------------------------------------------------------------------------------------------------------------------  RADIOLOGY:  Dg Chest Port 1 View  Result Date: 05/06/2017 CLINICAL DATA:  Shortness of breath EXAM: PORTABLE CHEST 1 VIEW COMPARISON:  Chest radiograph 09/12/2016 FINDINGS: Unchanged elevation of the left hemidiaphragm. The lungs are hyperinflated. Mildly increased opacities in the left mid lung. No pulmonary edema. No pneumothorax or sizable pleural effusion.  IMPRESSION: Left mid lung opacities could indicate developing consolidation, including the possibility of pneumonia. Hyperinflated lungs are consistent with COPD. Electronically Signed   By: Ulyses Jarred M.D.   On: 05/06/2017 17:42    EKG:  Sinus tachycardia no ST elevation or depression  IMPRESSION AND PLAN:   61 year old homeless male with no significant past medical history who presents with feelings of shortness of breath, cough and weakness and found to have pneumonia.  1. Sepsis: Patient presents with tachypnea, tachycardia and leukocytosis. Sepsis is from community acquired pneumonia Continue antibiotics and IV fluids  2. Community acquired pneumonia: Continue Levaquin  3.Tobacco dependence: Patient is encouraged to quit smoking. Counseling was provided for 4 minutes.  Patient will need case management assistance for d/c planning   All the records are reviewed and case discussed with ED provider. Management plans discussed with the patient and he in agreement  CODE STATUS: full  TOTAL TIME TAKING CARE OF THIS PATIENT: 41 minutes.    Bravlio Luca M.D on 05/06/2017 at 6:47 PM  Between 7am to 6pm - Pager - [phone]  After 6pm go to [email or web address] - password [email or web address] [facility]  Office  [phone]  CC: Primary care physician; Patient, No Pcp Per

## 2017-05-06 NOTE — Progress Notes (Signed)
Pharmacy Antibiotic Note  Nathan Mcintosh is a 61 y.o. male admitted on 05/06/2017 with CAP/sepsis.  Pharmacy has been consulted for levofloxacin dosing.  Plan: Levofloxacin 750 mg IV q 24 hours ordered.  Height: 5\' 5"  (165.1 cm) Weight: 115 lb (52.2 kg) IBW/kg (Calculated) : 61.5  Temp (24hrs), Avg:99.1 F (37.3 C), Min:99.1 F (37.3 C), Max:99.1 F (37.3 C)   Recent Labs Lab 05/06/17 1749 05/06/17 2113  WBC 13.5*  --   CREATININE 1.03  --   LATICACIDVEN 2.3* 2.3*    Estimated Creatinine Clearance: 57 mL/min (by C-G formula based on SCr of 1.03 mg/dL).    No Known Allergies  Antimicrobials this admission: Levofloxacin 10/29  >>    >>   Dose adjustments this admission:   Microbiology results: 10/29 BCx: pending      10/29 UA: pending 10/29 CXR: Left mid lung opacities, possibly pneumonia   Thank you for allowing pharmacy to be a part of this patient's care.  Brittnie Lewey S 05/06/2017 10:29 PM

## 2017-05-07 LAB — BASIC METABOLIC PANEL
Anion gap: 6 (ref 5–15)
BUN: 17 mg/dL (ref 6–20)
CALCIUM: 8 mg/dL — AB (ref 8.9–10.3)
CO2: 22 mmol/L (ref 22–32)
Chloride: 104 mmol/L (ref 101–111)
Creatinine, Ser: 0.97 mg/dL (ref 0.61–1.24)
Glucose, Bld: 108 mg/dL — ABNORMAL HIGH (ref 65–99)
Potassium: 3.8 mmol/L (ref 3.5–5.1)
SODIUM: 132 mmol/L — AB (ref 135–145)

## 2017-05-07 LAB — CBC
HCT: 32.1 % — ABNORMAL LOW (ref 40.0–52.0)
HEMOGLOBIN: 10.3 g/dL — AB (ref 13.0–18.0)
MCH: 25.1 pg — ABNORMAL LOW (ref 26.0–34.0)
MCHC: 32.2 g/dL (ref 32.0–36.0)
MCV: 78 fL — ABNORMAL LOW (ref 80.0–100.0)
Platelets: 258 10*3/uL (ref 150–440)
RBC: 4.12 MIL/uL — AB (ref 4.40–5.90)
RDW: 20 % — ABNORMAL HIGH (ref 11.5–14.5)
WBC: 7.8 10*3/uL (ref 3.8–10.6)

## 2017-05-07 NOTE — Care Management (Signed)
Patient admitted with sepsis.  Reported that patient lives [place] a car [place] his friends property.  Patient has active medicaid coverage.  Pharmacy [facility] in [place].  Anticipate discharge on oral antibiotics.

## 2017-05-07 NOTE — Progress Notes (Signed)
[place] [facility] [place] NAME: Nathan Mcintosh    MR#:  [id number]  DATE OF BIRTH:  1956/02/09  SUBJECTIVE:  Patient poor historian came in with shortness of breath and fever found to have pneumonia.  Doing well eating well.  No fever.  REVIEW OF SYSTEMS:   Review of Systems  Constitutional: Negative for chills, fever and weight loss.  HENT: Negative for ear discharge, ear pain and nosebleeds.   Eyes: Negative for blurred vision, pain and discharge.  Respiratory: Negative for sputum production, shortness of breath, wheezing and stridor.   Cardiovascular: Negative for chest pain, palpitations, orthopnea and PND.  Gastrointestinal: Negative for abdominal pain, diarrhea, nausea and vomiting.  Genitourinary: Negative for frequency and urgency.  Musculoskeletal: Negative for back pain and joint pain.  Neurological: Negative for sensory change, speech change, focal weakness and weakness.  Psychiatric/Behavioral: Negative for depression and hallucinations. The patient is not nervous/anxious.    Tolerating Diet:yes Tolerating PT:   DRUG ALLERGIES:  No Known Allergies  VITALS:  Blood pressure 132/88, pulse 85, temperature 98.9 F (37.2 C), temperature source Oral, resp. rate 20, height 5\' 5"  (1.651 m), weight 52.2 kg (115 lb), SpO2 98 %.  PHYSICAL EXAMINATION:   Physical Exam  GENERAL:  61 y.o.-year-old patient lying in the bed with no acute distress. Thin cachectic EYES: Pupils equal, round, reactive to light and accommodation. No scleral icterus. Extraocular muscles intact.  HEENT: Head atraumatic, normocephalic. Oropharynx and nasopharynx clear.  NECK:  Supple, no jugular venous distention. No thyroid enlargement, no tenderness.  LUNGS: Normal breath sounds bilaterally, no wheezing, rales, rhonchi. No use of accessory muscles of respiration.  CARDIOVASCULAR: S1, S2 normal. No murmurs, rubs, or gallops.  ABDOMEN: Soft, nontender,  nondistended. Bowel sounds present. No organomegaly or mass.  EXTREMITIES: No cyanosis, clubbing or edema b/l.    NEUROLOGIC: Cranial nerves II through XII are intact. No focal Motor or sensory deficits b/l.   PSYCHIATRIC:  patient is alert and oriented x 3.  SKIN: No obvious rash, lesion, or ulcer.   LABORATORY PANEL:  CBC  Recent Labs Lab 05/07/17 0424  WBC 7.8  HGB 10.3*  HCT 32.1*  PLT 258    Chemistries   Recent Labs Lab 05/06/17 1749 05/07/17 0424  NA 131* 132*  K 4.4 3.8  CL 96* 104  CO2 25 22  GLUCOSE 109* 108*  BUN 22* 17  CREATININE 1.03 0.97  CALCIUM 9.0 8.0*  AST 24  --   ALT 18  --   ALKPHOS 58  --   BILITOT 1.3*  --    Cardiac Enzymes  Recent Labs Lab 05/06/17 1749  TROPONINI 0.03*   RADIOLOGY:  Dg Chest Port 1 View  Result Date: 05/06/2017 CLINICAL DATA:  Shortness of breath EXAM: PORTABLE CHEST 1 VIEW COMPARISON:  Chest radiograph 09/12/2016 FINDINGS: Unchanged elevation of the left hemidiaphragm. The lungs are hyperinflated. Mildly increased opacities in the left mid lung. No pulmonary edema. No pneumothorax or sizable pleural effusion. IMPRESSION: Left mid lung opacities could indicate developing consolidation, including the possibility of pneumonia. Hyperinflated lungs are consistent with COPD. Electronically Signed   By: Ulyses Jarred M.D.   On: 05/06/2017 17:42   ASSESSMENT AND PLAN:   61 year old homeless male with no significant past medical history who presents with feelings of shortness of breath, cough and weakness and found to have pneumonia.  1. Sepsis on admission--resolved -: Patient presents with tachypnea, tachycardia and leukocytosis. -Sepsis is  from community acquired pneumonia -Continue antibiotics  --will change to more affordable po oral abxs  -recieved IV fluids  2. Community acquired pneumonia: Continue Levaquin  3.Tobacco dependence: Patient is encouraged to quit smoking. Counseling was provided for 4  minutes.  Patient will need case management assistance for d/c planning  Case discussed with Care Management/Social Worker. Management plans discussed with the patient, family and they are in agreement.  CODE STATUS: full  DVT Prophylaxis: lovenox  TOTAL TIME TAKING CARE OF THIS PATIENT: *40* minutes.  >50% time spent on counselling and coordination of care  POSSIBLE D/C IN *1-2* DAYS, DEPENDING ON CLINICAL CONDITION.  Note: This dictation was prepared with Dragon dictation along with smaller phrase technology. Any transcriptional errors that result from this process are unintentional.  Jowan Skillin M.D on 05/07/2017 at 2:42 PM  Between 7am to 6pm - Pager - [phone]  After 6pm go to [email or web address] - password [email or web address] [facility]  Office  [phone]  CC: Primary care physician; Patient, No Pcp Per

## 2017-05-08 LAB — URINALYSIS, COMPLETE (UACMP) WITH MICROSCOPIC
BILIRUBIN URINE: NEGATIVE
Glucose, UA: NEGATIVE mg/dL
HGB URINE DIPSTICK: NEGATIVE
KETONES UR: NEGATIVE mg/dL
NITRITE: NEGATIVE
PH: 5 (ref 5.0–8.0)
Protein, ur: NEGATIVE mg/dL
SPECIFIC GRAVITY, URINE: 1.024 (ref 1.005–1.030)

## 2017-05-08 LAB — HIV ANTIBODY (ROUTINE TESTING W REFLEX): HIV Screen 4th Generation wRfx: NONREACTIVE

## 2017-05-08 MED ORDER — AMOXICILLIN-POT CLAVULANATE 875-125 MG PO TABS: 1.0000 | ORAL_TABLET | Freq: Two times a day (BID) | ORAL | 0 refills | Status: DC

## 2017-05-08 MED ORDER — LEVOFLOXACIN 750 MG PO TABS: 750.0000 mg | ORAL_TABLET | Freq: Every day | ORAL | Status: DC

## 2017-05-08 MED ORDER — METOPROLOL TARTRATE 25 MG PO TABS: 25.0000 mg | ORAL_TABLET | Freq: Two times a day (BID) | ORAL | Status: DC

## 2017-05-08 MED ORDER — METOPROLOL TARTRATE 25 MG PO TABS: 25.0000 mg | ORAL_TABLET | Freq: Two times a day (BID) | ORAL | 0 refills | Status: DC

## 2017-05-08 MED ORDER — METOPROLOL TARTRATE 25 MG PO TABS
25.0000 mg | ORAL_TABLET | Freq: Two times a day (BID) | ORAL | Status: DC
  Administered 2017-05-08: 25 mg via ORAL
  Filled 2017-05-08: qty 1

## 2017-05-08 MED ORDER — AMOXICILLIN-POT CLAVULANATE 875-125 MG PO TABS
1.0000 | ORAL_TABLET | Freq: Two times a day (BID) | ORAL | Status: DC
  Administered 2017-05-08: 1 via ORAL
  Filled 2017-05-08: qty 1

## 2017-05-08 NOTE — Progress Notes (Signed)
Nathan Mcintosh  A and O x 4. VSS. Pt tolerating diet well. No complaints of pain or nausea. IV removed intact, prescriptions given. Pt voiced understanding of discharge instructions with no further questions. Pt discharged via wheelchair with axillary.  Lynann Bologna MSN, RN-BC  Allergies as of 05/08/2017   No Known Allergies     Medication List    STOP taking these medications   azithromycin 250 MG tablet Commonly known as:  ZITHROMAX Z-PAK   benzonatate 100 MG capsule Commonly known as:  TESSALON PERLES   mirtazapine 30 MG tablet Commonly known as:  REMERON   OLANZapine 20 MG tablet Commonly known as:  ZYPREXA   predniSONE 10 MG tablet Commonly known as:  DELTASONE     TAKE these medications   albuterol 108 (90 Base) MCG/ACT inhaler Commonly known as:  PROVENTIL HFA;VENTOLIN HFA Inhale 2 puffs into the lungs every 4 (four) hours as needed for wheezing.   amoxicillin-clavulanate 875-125 MG tablet Commonly known as:  AUGMENTIN Take 1 tablet by mouth every 12 (twelve) hours.   metoprolol tartrate 25 MG tablet Commonly known as:  LOPRESSOR Take 1 tablet (25 mg total) by mouth 2 (two) times daily.       Vitals:   05/08/17 1226 05/08/17 1240  BP: (!) 152/102 (!) 150/100  Pulse: 92   Resp:    Temp:    SpO2: 96%

## 2017-05-08 NOTE — Discharge Instructions (Signed)
Patient advised to go to URgent care or ER if needed He is to get PCP in the area

## 2017-05-08 NOTE — Care Management (Signed)
Patient to discharge today.  Patient states that sometimes he lives in an abandon building, and other times he stays with a friend Nathan [name]  Patient does state that his Medicaid is active.  With patients permission RNCM called Nathan Mcintosh at [phone] for transportation at discharge.  Patient states that he will most likely stay with Nathan Mcintosh at discharge.  Patient to discharge on Augmentin.  With patient's Medicaid it should be $3 out of pocket, I have also provided the patient with a coupon from [employer] should he have trouble picking up with Medicaid benefits $19.07.  Patient states, I will probably be able to get my medicine.   RNCM called the phone number provided, and there was no response  Update:  Return call from [place].  Nathan Mcintosh states that the patient is supposed to be staying with him, however patient made the decision to go "stay at that old house".  Nathan Mcintosh states that the patient receives a check each month, but that if he doesn't have the money he will purchase it for him.  RNCM singing off

## 2017-05-08 NOTE — Discharge Summary (Addendum)
[place] at [place] NAME: Nathan Mcintosh    MR#:  [id number]  DATE OF BIRTH:  06/15/1956  DATE OF ADMISSION:  05/06/2017 ADMITTING PHYSICIAN: Bettey Costa, MD  DATE OF DISCHARGE: 05/08/2017  PRIMARY CARE PHYSICIAN: Patient, No Pcp Per    ADMISSION DIAGNOSIS:  Community acquired pneumonia of left lung, unspecified part of lung [J18.9]  DISCHARGE DIAGNOSIS:  Community acquired pneumonia Sepsis present on admission resolved SECONDARY DIAGNOSIS:  History reviewed. No pertinent past medical history.  HOSPITAL COURSE:  61 year old homeless male with no significant past medical history who presents with feelings of shortness of breath, cough and weakness and found to have pneumonia.  1. Sepsis on admission--resolved -: Patient presents with tachypnea, tachycardia and leukocytosis. -Sepsis is from community acquired pneumonia -Continue antibiotics  --will change to more affordable po oral abxs --changed to oral Augmentin -recieved IV fluids  2. Community acquired pneumonia: Now on oral Augmentin  3.Tobacco dependence: Patient is encouraged to quit smoking. Counseling was provided for 4 minutes  4. Elevated BP/HTn start metoprolol 25 mg bid.  Discharge to home. Patient advised to get primary care physician in the area.  CONSULTS OBTAINED:    DRUG ALLERGIES:  No Known Allergies  DISCHARGE MEDICATIONS:   Current Discharge Medication List    START taking these medications   Details  amoxicillin-clavulanate (AUGMENTIN) 875-125 MG tablet Take 1 tablet by mouth every 12 (twelve) hours. Qty: 12 tablet, Refills: 0    metoprolol tartrate (LOPRESSOR) 25 MG tablet Take 1 tablet (25 mg total) by mouth 2 (two) times daily. Qty: 60 tablet, Refills: 0      CONTINUE these medications which have NOT CHANGED   Details  albuterol (PROVENTIL HFA;VENTOLIN HFA) 108 (90 Base) MCG/ACT inhaler Inhale 2 puffs into the lungs every 4 (four)  hours as needed for wheezing. Qty: 1 Inhaler, Refills: 0      STOP taking these medications     azithromycin (ZITHROMAX Z-PAK) 250 MG tablet      benzonatate (TESSALON PERLES) 100 MG capsule      mirtazapine (REMERON) 30 MG tablet      OLANZapine (ZYPREXA) 20 MG tablet      predniSONE (DELTASONE) 10 MG tablet         If you experience worsening of your admission symptoms, develop shortness of breath, life threatening emergency, suicidal or homicidal thoughts you must seek medical attention immediately by calling 911 or calling your MD immediately  if symptoms less severe.  You Must read complete instructions/literature along with all the possible adverse reactions/side effects for all the Medicines you take and that have been prescribed to you. Take any new Medicines after you have completely understood and accept all the possible adverse reactions/side effects.   Please note  You were cared for by a hospitalist during your hospital stay. If you have any questions about your discharge medications or the care you received while you were in the hospital after you are discharged, you can call the unit and asked to speak with the hospitalist on call if the hospitalist that took care of you is not available. Once you are discharged, your primary care physician will handle any further medical issues. Please note that NO REFILLS for any discharge medications will be authorized once you are discharged, as it is imperative that you return to your primary care physician (or establish a relationship with a primary care physician if you do not have one) for your aftercare  needs so that they can reassess your need for medications and monitor your lab values. Today   SUBJECTIVE  Doing well.   VITAL SIGNS:  Blood pressure (!) 150/100, pulse 92, temperature 98 F (36.7 C), temperature source Oral, resp. rate (!) 24, height 5\' 5"  (1.651 m), weight 52.2 kg (115 lb), SpO2 96 %.  I/O:     Intake/Output Summary (Last 24 hours) at 05/08/17 1244 Last data filed at 05/08/17 1201  Gross per 24 hour  Intake             2966 ml  Output              200 ml  Net             2766 ml    PHYSICAL EXAMINATION:  GENERAL:  61 y.o.-year-old patient lying in the bed with no acute distress. EYES: Pupils equal, round, reactive to light and accommodation. No scleral icterus. Extraocular muscles intact.  HEENT: Head atraumatic, normocephalic. Oropharynx and nasopharynx clear. Poor dentition NECK:  Supple, no jugular venous distention. No thyroid enlargement, no tenderness.  LUNGS: Normal breath sounds bilaterally, no wheezing, rales,rhonchi or crepitation. No use of accessory muscles of respiration.  CARDIOVASCULAR: S1, S2 normal. No murmurs, rubs, or gallops.  ABDOMEN: Soft, non-tender, non-distended. Bowel sounds present. No organomegaly or mass.  EXTREMITIES: No pedal edema, cyanosis, or clubbing.  NEUROLOGIC: Cranial nerves II through XII are intact. Muscle strength 5/5 in all extremities. Sensation intact. Gait not checked.  PSYCHIATRIC: The patient is alert and oriented x 3.  SKIN: No obvious rash, lesion, or ulcer.   DATA REVIEW:   CBC   Recent Labs Lab 05/07/17 0424  WBC 7.8  HGB 10.3*  HCT 32.1*  PLT 258    Chemistries   Recent Labs Lab 05/06/17 1749 05/07/17 0424  NA 131* 132*  K 4.4 3.8  CL 96* 104  CO2 25 22  GLUCOSE 109* 108*  BUN 22* 17  CREATININE 1.03 0.97  CALCIUM 9.0 8.0*  AST 24  --   ALT 18  --   ALKPHOS 58  --   BILITOT 1.3*  --     Microbiology Results   Recent Results (from the past 240 hour(s))  Blood Culture (routine x 2)     Status: None (Preliminary result)   Collection Time: 05/06/17  5:49 PM  Result Value Ref Range Status   Specimen Description BLOOD LEFT WRIST  Final   Special Requests   Final    BOTTLES DRAWN AEROBIC AND ANAEROBIC Blood Culture results may not be optimal due to an inadequate volume of blood received in  culture bottles   Culture NO GROWTH 2 DAYS  Final   Report Status PENDING  Incomplete  Blood Culture (routine x 2)     Status: None (Preliminary result)   Collection Time: 05/06/17  5:50 PM  Result Value Ref Range Status   Specimen Description BLOOD LEFT ANTECUBITAL  Final   Special Requests   Final    BOTTLES DRAWN AEROBIC AND ANAEROBIC Blood Culture adequate volume   Culture NO GROWTH 2 DAYS  Final   Report Status PENDING  Incomplete    RADIOLOGY:  Dg Chest Port 1 View  Result Date: 05/06/2017 CLINICAL DATA:  Shortness of breath EXAM: PORTABLE CHEST 1 VIEW COMPARISON:  Chest radiograph 09/12/2016 FINDINGS: Unchanged elevation of the left hemidiaphragm. The lungs are hyperinflated. Mildly increased opacities in the left mid lung. No pulmonary edema. No pneumothorax or sizable pleural  effusion. IMPRESSION: Left mid lung opacities could indicate developing consolidation, including the possibility of pneumonia. Hyperinflated lungs are consistent with COPD. Electronically Signed   By: Ulyses Jarred M.D.   On: 05/06/2017 17:42     Management plans discussed with the patient, family and they are in agreement.  CODE STATUS:     Code Status Orders        Start     Ordered   05/06/17 2038  Full code  Continuous     05/06/17 2037    Code Status History    Date Active Date Inactive Code Status Order ID Comments User Context   12/25/2014 12:34 AM 12/31/2014  6:47 PM Full Code [id number]  Clapacs, Madie Reno, MD Inpatient      TOTAL TIME TAKING CARE OF THIS PATIENT: *40* minutes.    Syna Gad M.D on 05/08/2017 at 12:44 PM  Between 7am to 6pm - Pager - [phone] After 6pm go to [email or web address] - password [email or web address] [facility]  Office  [phone]  CC: Primary care physician; Patient, No Pcp Per

## 2017-05-08 NOTE — Progress Notes (Signed)
Notified Dr. Posey Pronto of BP 150/100 per MD place order for Metoprolol 25mg  BID PO.

## 2017-05-08 NOTE — Progress Notes (Signed)
PHARMACIST - PHYSICIAN COMMUNICATION DR:   patel CONCERNING: Antibiotic IV to Oral Route Change Policy  RECOMMENDATION: This patient is receiving levofloxacin by the intravenous route.  Based on criteria approved by the Pharmacy and Therapeutics Committee, the antibiotic(s) is/are being converted to the equivalent oral dose form(s).   DESCRIPTION: These criteria include:  Patient being treated for a respiratory tract infection, urinary tract infection, cellulitis or clostridium difficile associated diarrhea if on metronidazole  The patient is not neutropenic and does not exhibit a GI malabsorption state  The patient is eating (either orally or via tube) and/or has been taking other orally administered medications for a least 24 hours  The patient is improving clinically and has a Tmax < 100.5  If you have questions about this conversion, please contact the Pharmacy Department  []   [phone] )  [facility] [x]   [phone] )  [facility] []   [phone] )  [facility] []   [phone] )  [facility] []   [phone] )  [facility]

## 2017-05-11 LAB — CULTURE, BLOOD (ROUTINE X 2)
CULTURE: NO GROWTH
Culture: NO GROWTH
Special Requests: ADEQUATE

## 2017-10-30 ENCOUNTER — Emergency Department: Payer: Medicaid Other

## 2017-10-30 ENCOUNTER — Encounter: Payer: Self-pay | Admitting: Emergency Medicine

## 2017-10-30 ENCOUNTER — Other Ambulatory Visit: Payer: Self-pay

## 2017-10-30 ENCOUNTER — Inpatient Hospital Stay
Admission: EM | Admit: 2017-10-30 | Discharge: 2017-11-07 | DRG: 871 | Disposition: A | Discharge status: Hospice | Payer: Medicaid Other | Attending: Family Medicine | Admitting: Family Medicine

## 2017-10-30 DIAGNOSIS — R0682 Tachypnea, not elsewhere classified: Secondary | ICD-10-CM

## 2017-10-30 DIAGNOSIS — F05 Delirium due to known physiological condition: Secondary | ICD-10-CM

## 2017-10-30 DIAGNOSIS — E46 Unspecified protein-calorie malnutrition: Secondary | ICD-10-CM | POA: Diagnosis present

## 2017-10-30 DIAGNOSIS — Z66 Do not resuscitate: Secondary | ICD-10-CM | POA: Diagnosis not present

## 2017-10-30 DIAGNOSIS — Z87891 Personal history of nicotine dependence: Secondary | ICD-10-CM

## 2017-10-30 DIAGNOSIS — R109 Unspecified abdominal pain: Secondary | ICD-10-CM | POA: Diagnosis present

## 2017-10-30 DIAGNOSIS — C786 Secondary malignant neoplasm of retroperitoneum and peritoneum: Secondary | ICD-10-CM | POA: Diagnosis present

## 2017-10-30 DIAGNOSIS — E43 Unspecified severe protein-calorie malnutrition: Secondary | ICD-10-CM

## 2017-10-30 DIAGNOSIS — C169 Malignant neoplasm of stomach, unspecified: Secondary | ICD-10-CM | POA: Diagnosis present

## 2017-10-30 DIAGNOSIS — K769 Liver disease, unspecified: Secondary | ICD-10-CM

## 2017-10-30 DIAGNOSIS — R160 Hepatomegaly, not elsewhere classified: Secondary | ICD-10-CM

## 2017-10-30 DIAGNOSIS — G893 Neoplasm related pain (acute) (chronic): Secondary | ICD-10-CM | POA: Diagnosis present

## 2017-10-30 DIAGNOSIS — J189 Pneumonia, unspecified organism: Secondary | ICD-10-CM | POA: Diagnosis present

## 2017-10-30 DIAGNOSIS — R2681 Unsteadiness on feet: Secondary | ICD-10-CM | POA: Diagnosis present

## 2017-10-30 DIAGNOSIS — Z515 Encounter for palliative care: Secondary | ICD-10-CM | POA: Diagnosis present

## 2017-10-30 DIAGNOSIS — R410 Disorientation, unspecified: Secondary | ICD-10-CM

## 2017-10-30 DIAGNOSIS — C787 Secondary malignant neoplasm of liver and intrahepatic bile duct: Secondary | ICD-10-CM | POA: Diagnosis present

## 2017-10-30 DIAGNOSIS — R64 Cachexia: Secondary | ICD-10-CM | POA: Diagnosis present

## 2017-10-30 DIAGNOSIS — R112 Nausea with vomiting, unspecified: Secondary | ICD-10-CM

## 2017-10-30 DIAGNOSIS — G9341 Metabolic encephalopathy: Secondary | ICD-10-CM | POA: Diagnosis present

## 2017-10-30 DIAGNOSIS — F323 Major depressive disorder, single episode, severe with psychotic features: Secondary | ICD-10-CM | POA: Diagnosis present

## 2017-10-30 DIAGNOSIS — D509 Iron deficiency anemia, unspecified: Secondary | ICD-10-CM | POA: Diagnosis present

## 2017-10-30 DIAGNOSIS — Z6820 Body mass index (BMI) 20.0-20.9, adult: Secondary | ICD-10-CM

## 2017-10-30 DIAGNOSIS — F1020 Alcohol dependence, uncomplicated: Secondary | ICD-10-CM | POA: Diagnosis present

## 2017-10-30 DIAGNOSIS — I10 Essential (primary) hypertension: Secondary | ICD-10-CM | POA: Diagnosis present

## 2017-10-30 DIAGNOSIS — A419 Sepsis, unspecified organism: Principal | ICD-10-CM | POA: Diagnosis present

## 2017-10-30 DIAGNOSIS — R1011 Right upper quadrant pain: Secondary | ICD-10-CM

## 2017-10-30 LAB — COMPREHENSIVE METABOLIC PANEL
ALK PHOS: 260 U/L — AB (ref 38–126)
ALT: 110 U/L — AB (ref 17–63)
ANION GAP: 13 (ref 5–15)
AST: 244 U/L — ABNORMAL HIGH (ref 15–41)
Albumin: 2.5 g/dL — ABNORMAL LOW (ref 3.5–5.0)
BILIRUBIN TOTAL: 0.8 mg/dL (ref 0.3–1.2)
BUN: 21 mg/dL — ABNORMAL HIGH (ref 6–20)
CALCIUM: 8.5 mg/dL — AB (ref 8.9–10.3)
CO2: 22 mmol/L (ref 22–32)
CREATININE: 0.72 mg/dL (ref 0.61–1.24)
Chloride: 104 mmol/L (ref 101–111)
GFR calc non Af Amer: >60 mL/min (ref >60)
GLUCOSE: 118 mg/dL — AB (ref 65–99)
Potassium: 4.4 mmol/L (ref 3.5–5.1)
SODIUM: 139 mmol/L (ref 135–145)
TOTAL PROTEIN: 7.4 g/dL (ref 6.5–8.1)

## 2017-10-30 LAB — CBC WITH DIFFERENTIAL/PLATELET
Basophils Absolute: 0 10*3/uL (ref 0–0.1)
Basophils Relative: 0 %
EOS PCT: 0 %
Eosinophils Absolute: 0 10*3/uL (ref 0–0.7)
HCT: 39.3 % — ABNORMAL LOW (ref 40.0–52.0)
Hemoglobin: 12.5 g/dL — ABNORMAL LOW (ref 13.0–18.0)
LYMPHS ABS: 0.8 10*3/uL — AB (ref 1.0–3.6)
LYMPHS PCT: 9 %
MCH: 25.3 pg — AB (ref 26.0–34.0)
MCHC: 31.9 g/dL — AB (ref 32.0–36.0)
MCV: 79.3 fL — AB (ref 80.0–100.0)
MONO ABS: 0.3 10*3/uL (ref 0.2–1.0)
Monocytes Relative: 4 %
Neutro Abs: 7.6 10*3/uL — ABNORMAL HIGH (ref 1.4–6.5)
Neutrophils Relative %: 87 %
PLATELETS: 195 10*3/uL (ref 150–440)
RBC: 4.95 MIL/uL (ref 4.40–5.90)
RDW: 22.9 % — AB (ref 11.5–14.5)
WBC: 8.7 10*3/uL (ref 3.8–10.6)

## 2017-10-30 LAB — GLUCOSE, CAPILLARY: Glucose-Capillary: 104 mg/dL — ABNORMAL HIGH (ref 65–99)

## 2017-10-30 LAB — TROPONIN I

## 2017-10-30 LAB — LIPASE, BLOOD: Lipase: 15 U/L (ref 11–51)

## 2017-10-30 LAB — LACTIC ACID, PLASMA: Lactic Acid, Venous: 4.4 mmol/L (ref 0.5–1.9)

## 2017-10-30 LAB — AMMONIA: Ammonia: 24 umol/L (ref 9–35)

## 2017-10-30 MED ORDER — THIAMINE HCL 100 MG/ML IJ SOLN
Freq: Once | INTRAVENOUS | Status: AC
  Administered 2017-10-30: 23:00:00 via INTRAVENOUS
  Filled 2017-10-30: qty 1000

## 2017-10-30 MED ORDER — IOPAMIDOL (ISOVUE-300) INJECTION 61%
30.0000 mL | Freq: Once | INTRAVENOUS | Status: AC
  Administered 2017-10-30: 30 mL via ORAL

## 2017-10-30 MED ORDER — SODIUM CHLORIDE 0.9 % IV BOLUS
1000.0000 mL | Freq: Once | INTRAVENOUS | Status: AC
  Administered 2017-10-30: 1000 mL via INTRAVENOUS

## 2017-10-30 MED ORDER — FENTANYL CITRATE (PF) 100 MCG/2ML IJ SOLN
50.0000 ug | Freq: Once | INTRAMUSCULAR | Status: AC
  Administered 2017-10-31: 50 ug via INTRAVENOUS
  Filled 2017-10-30: qty 2

## 2017-10-30 MED ORDER — ONDANSETRON HCL 4 MG/2ML IJ SOLN
4.0000 mg | Freq: Once | INTRAMUSCULAR | Status: AC
  Administered 2017-10-30: 4 mg via INTRAVENOUS
  Filled 2017-10-30: qty 2

## 2017-10-30 MED ORDER — CEFTRIAXONE SODIUM 1 G IJ SOLR
1.0000 g | Freq: Once | INTRAMUSCULAR | Status: AC
  Administered 2017-10-31: 1 g via INTRAVENOUS
  Filled 2017-10-30: qty 10

## 2017-10-30 NOTE — ED Notes (Signed)
Pt stuck x3 in triage, no success with blood collection. Pt stuck x2 in room per the RN, this RN able to place x2 IVs but unsuccessful with blood collection.

## 2017-10-30 NOTE — ED Notes (Addendum)
Phlebotomy attempt by EDT Mayra and Raquel RN without success; pt taken to room 12 via w/c by EDT Mayra to be placed on monitor for further eval; report called to care nurse Lorriane Shire, RN

## 2017-10-30 NOTE — ED Triage Notes (Addendum)
Pt to triage via w/c, appears weak with very soft speech, pt emaciated, oral mucosa dry; pt reports generalized weakness with frequent falls for several months; pt st "I think I got pneumonia"; st has been seen for same but unable to voice specific c/o or diagnosis; reports multiple c/o inc. CP, abd pain, coughing but does not give any further details

## 2017-10-30 NOTE — ED Provider Notes (Addendum)
[facility] Emergency Department Provider Note  ____________________________________________  Time seen: Approximately 9:56 PM  I have reviewed the triage vital signs and the nursing notes.   HISTORY  Chief Complaint Weakness    HPI Nathan Mcintosh is a 62 y.o. male with a history of malnutrition and alcohol abuse presenting for abdominal pain.  The patient's history is limited due to his altered mental status.  The patient gives a patched together history.  It appears that he is here today because for several months he has been having significant weight loss, daily nausea and vomiting, and diffuse abdominal pain.  He reports that he is a chronic daily user of alcohol but stopped drinking an unknown number of days ago.  He states he is having normal bowel movements and denies any fever or chills, dysuria, chest pain, shortness of breath, cough or cold symptoms.  On arrival to the emergency department, the patient is disheveled, cachectic and alert and oriented only to person and place.  SH: Positive alcohol dependence; lives with a friend in a house.   History reviewed. No pertinent past medical history.  Patient Active Problem List   Diagnosis Date Noted  . Sepsis ([place]) 05/06/2017  . 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 Severe major depression with psychotic features ([place]) 12/24/2014  . Alcohol abuse 12/24/2014  . Malnutrition ([place]) 12/24/2014    History reviewed. No pertinent surgical history.  Current Outpatient Rx  . Order #: [id number] Class: Normal  . Order #: [id number] Class: Normal  . Order #: [id number] Class: Normal    Allergies Patient has no known allergies.  No family history on file.  Social History Social History   Tobacco Use  . Smoking status: Former Smoker    Packs/day: 1.00    Years: 30.00    Pack years: 30.00    Last attempt to quit: 12/25/2014    Years since quitting: 2.8  .  Smokeless tobacco: Never Used  Substance Use Topics  . Alcohol use: Not Currently    Alcohol/week: 2.4 - 3.0 oz    Types: 4 - 5 Cans of beer per week  . Drug use: No    Review of Systems [place] due to patient altered mental status. Constitutional: No fever/chills. Eyes: No visual changes. ENT: No sore throat. No congestion or rhinorrhea. Cardiovascular: Denies chest pain. Denies palpitations. Respiratory: Denies shortness of breath.  No cough. Gastrointestinal: Positive diffuse abdominal pain.  Positive daily nausea and vomiting.    No diarrhea.  No constipation. Genitourinary: Negative for dysuria. Musculoskeletal: Negative for back pain. Skin: Negative for rash. Neurological: Negative for headaches. No focal numbness, tingling or weakness.     ____________________________________________   PHYSICAL EXAM:  VITAL SIGNS: ED Triage Vitals [10/30/17 2045]  Enc Vitals Group     BP      Pulse      Resp      Temp      Temp src      SpO2      Weight 115 lb (52.2 kg)     Height      Head Circumference      Peak Flow      Pain Score      Pain Loc      Pain Edu?      Excl. in [place]?     Constitutional: Patient is chronically ill-appearing, cachectic, and has dry mucous membranes.  He is alert and oriented to person and place, but does  not know the year.  He is not able to answer all questions and has a significant loss of sense of time when I am interviewing him. Eyes: Conjunctivae are normal.  EOMI. positive scleral icterus. Head: Atraumatic. Nose: No congestion/rhinnorhea. Mouth/Throat: Mucous membranes are moist.  Neck: No stridor.  Supple.  Positive JVD.  Negative meningismus. Cardiovascular: Normal rate, regular rhythm. No murmurs, rubs or gallops.  Respiratory: Normal respiratory effort.  No accessory muscle use or retractions. Lungs CTAB.  No wheezes, rales or ronchi. Gastrointestinal: Soft, and nondistended.  The patient has a palpable liver edge which extends to the  umbilicus.  He has tenderness to palpation over the right upper quadrant without Murphy sign.  No guarding or rebound.  No peritoneal signs. Musculoskeletal: No LE edema. No ttp in the calves or palpable cords.  Negative Homan's sign. Neurologic:  A&Ox3.  Speech is clear.  Face and smile are symmetric.  EOMI.  Moves all extremities well. Skin:  Skin is warm, dry and intact. No rash noted. Psychiatric: Mood and affect are normal.   ____________________________________________   LABS (all labs ordered are listed, but only abnormal results are displayed)  Labs Reviewed  CBC WITH DIFFERENTIAL/PLATELET - Abnormal; Notable for the following components:      Result Value   Hemoglobin 12.5 (*)    HCT 39.3 (*)    MCV 79.3 (*)    MCH 25.3 (*)    MCHC 31.9 (*)    RDW 22.9 (*)    Neutro Abs 7.6 (*)    Lymphs Abs 0.8 (*)    All other components within normal limits  GLUCOSE, CAPILLARY - Abnormal; Notable for the following components:   Glucose-Capillary 104 (*)    All other components within normal limits  BLOOD GAS, VENOUS - Abnormal; Notable for the following components:   pCO2, Ven 43 (*)    pO2, Ven <31.0 (*)    Acid-base deficit 2.6 (*)    All other components within normal limits  COMPREHENSIVE METABOLIC PANEL  LIPASE, BLOOD  TROPONIN I  URINALYSIS, COMPLETE (UACMP) WITH MICROSCOPIC  AMMONIA  URINE DRUG SCREEN, QUALITATIVE ([facility] ONLY)  ETHANOL  LACTIC ACID, PLASMA  HIV ANTIBODY (ROUTINE TESTING)  CBG MONITORING, ED   ____________________________________________  EKG  ED ECG REPORT I, Eula Listen, the attending physician, personally viewed and interpreted this ECG.   Date: 10/30/2017  EKG Time: 2054  Rate: 88  Rhythm: normal sinus rhythm  Axis: normal  Intervals:none  ST&T Change: no STEMI  ____________________________________________  RADIOLOGY  Dg Chest Portable 1 View  Result Date: 10/30/2017 CLINICAL DATA:  Cough EXAM: PORTABLE CHEST 1 VIEW  COMPARISON:  05/06/2017 FINDINGS: Hyperinflation. No acute consolidation or effusion. Normal heart size. No pneumothorax. IMPRESSION: No active disease.  Hyperinflation. Electronically Signed   By: Donavan Foil M.D.   On: 10/30/2017 21:23    ____________________________________________   PROCEDURES  Procedure(s) performed: None  Procedures  Critical Care performed: No ____________________________________________   INITIAL IMPRESSION / ASSESSMENT AND PLAN / ED COURSE  Pertinent labs & imaging results that were available during my care of the patient were reviewed by me and considered in my medical decision making (see chart for details).  62 y.o. male who presents for diffuse abdominal pain and daily nausea and vomiting with significant weight loss, question overall picture of chronic illness with cachexia.  Overall, I am concerned that the patient may have a malignancy or other immunocompromise state although he has an HIV test from November 2018  which was negative when he was admitted for pneumonia with sepsis.  The patient may also have malnutrition and failure to thrive from his alcohol abuse.  I am concerned with his altered mental status for Warnicke's encephalopathy and will treat the patient with a banana bag.  He will also be initiated on CIWA protocol as he is high risk for alcohol withdrawal and delirium tremens.  We will evaluate him for infection although he is afebrile.  The patient will likely need admission to the hospital for continued evaluation and treatment.  The patient has been signed out to the oncoming physician for final disposition.  ____________________________________________  FINAL CLINICAL IMPRESSION(S) / ED DIAGNOSES  Final diagnoses:  Right upper quadrant abdominal pain  Nausea and vomiting, intractability of vomiting not specified, unspecified vomiting type         NEW MEDICATIONS STARTED DURING THIS VISIT:  New Prescriptions   No medications  on file      Eula Listen, MD 10/30/17 2259    Eula Listen, MD 10/30/17 [phone]

## 2017-10-30 NOTE — ED Provider Notes (Signed)
I appreciate the patient's elevated lactic acid at 4.4, however given the high clinical suspicion for acute alcoholic hepatitis this likely represents decreased clearance rather than true sepsis.  Regardless we will cover with antibiotics at this point.  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  Given his deconditioned state and under resuscitation he requires inpatient admission.  I have discussed with the hospitalist who has graciously agreed to admit the patient to his service.  CRITICAL CARE Performed by: Darel Hong   Total critical care time: 30 minutes  Critical care time was exclusive of separately billable procedures and treating other patients.  Critical care was necessary to treat or prevent imminent or life-threatening deterioration.  Critical care was time spent personally by me on the following activities: development of treatment plan with patient and/or surrogate as well as nursing, discussions with consultants, evaluation of patient's response to treatment, examination of patient, obtaining history from patient or surrogate, ordering and performing treatments and interventions, ordering and review of laboratory studies, ordering and review of radiographic studies, pulse oximetry and re-evaluation of patient's condition.    Darel Hong, MD 10/31/17 2121

## 2017-10-31 ENCOUNTER — Other Ambulatory Visit: Payer: Self-pay

## 2017-10-31 ENCOUNTER — Emergency Department: Payer: Medicaid Other

## 2017-10-31 DIAGNOSIS — C169 Malignant neoplasm of stomach, unspecified: Secondary | ICD-10-CM | POA: Diagnosis present

## 2017-10-31 DIAGNOSIS — Z515 Encounter for palliative care: Secondary | ICD-10-CM | POA: Diagnosis present

## 2017-10-31 DIAGNOSIS — R109 Unspecified abdominal pain: Secondary | ICD-10-CM | POA: Diagnosis present

## 2017-10-31 DIAGNOSIS — G9341 Metabolic encephalopathy: Secondary | ICD-10-CM | POA: Diagnosis present

## 2017-10-31 DIAGNOSIS — E43 Unspecified severe protein-calorie malnutrition: Secondary | ICD-10-CM

## 2017-10-31 DIAGNOSIS — C787 Secondary malignant neoplasm of liver and intrahepatic bile duct: Secondary | ICD-10-CM

## 2017-10-31 DIAGNOSIS — Z6820 Body mass index (BMI) 20.0-20.9, adult: Secondary | ICD-10-CM | POA: Diagnosis not present

## 2017-10-31 DIAGNOSIS — R1900 Intra-abdominal and pelvic swelling, mass and lump, unspecified site: Secondary | ICD-10-CM | POA: Diagnosis not present

## 2017-10-31 DIAGNOSIS — G893 Neoplasm related pain (acute) (chronic): Secondary | ICD-10-CM

## 2017-10-31 DIAGNOSIS — F05 Delirium due to known physiological condition: Secondary | ICD-10-CM | POA: Diagnosis not present

## 2017-10-31 DIAGNOSIS — Z66 Do not resuscitate: Secondary | ICD-10-CM | POA: Diagnosis not present

## 2017-10-31 DIAGNOSIS — R2681 Unsteadiness on feet: Secondary | ICD-10-CM | POA: Diagnosis present

## 2017-10-31 DIAGNOSIS — F323 Major depressive disorder, single episode, severe with psychotic features: Secondary | ICD-10-CM | POA: Diagnosis present

## 2017-10-31 DIAGNOSIS — R64 Cachexia: Secondary | ICD-10-CM | POA: Diagnosis present

## 2017-10-31 DIAGNOSIS — I10 Essential (primary) hypertension: Secondary | ICD-10-CM | POA: Diagnosis present

## 2017-10-31 DIAGNOSIS — R1011 Right upper quadrant pain: Secondary | ICD-10-CM

## 2017-10-31 DIAGNOSIS — F1020 Alcohol dependence, uncomplicated: Secondary | ICD-10-CM | POA: Diagnosis present

## 2017-10-31 DIAGNOSIS — C801 Malignant (primary) neoplasm, unspecified: Secondary | ICD-10-CM | POA: Diagnosis not present

## 2017-10-31 DIAGNOSIS — J189 Pneumonia, unspecified organism: Secondary | ICD-10-CM | POA: Diagnosis present

## 2017-10-31 DIAGNOSIS — Z87891 Personal history of nicotine dependence: Secondary | ICD-10-CM | POA: Diagnosis not present

## 2017-10-31 DIAGNOSIS — R112 Nausea with vomiting, unspecified: Secondary | ICD-10-CM | POA: Diagnosis not present

## 2017-10-31 DIAGNOSIS — A419 Sepsis, unspecified organism: Secondary | ICD-10-CM | POA: Diagnosis present

## 2017-10-31 DIAGNOSIS — R531 Weakness: Secondary | ICD-10-CM | POA: Diagnosis present

## 2017-10-31 DIAGNOSIS — C786 Secondary malignant neoplasm of retroperitoneum and peritoneum: Secondary | ICD-10-CM | POA: Diagnosis present

## 2017-10-31 DIAGNOSIS — D509 Iron deficiency anemia, unspecified: Secondary | ICD-10-CM | POA: Diagnosis present

## 2017-10-31 LAB — URINALYSIS, COMPLETE (UACMP) WITH MICROSCOPIC
Bilirubin Urine: NEGATIVE
GLUCOSE, UA: NEGATIVE mg/dL
HGB URINE DIPSTICK: NEGATIVE
Ketones, ur: NEGATIVE mg/dL
Leukocytes, UA: NEGATIVE
NITRITE: NEGATIVE
Protein, ur: NEGATIVE mg/dL
SPECIFIC GRAVITY, URINE: 1.038 — AB (ref 1.005–1.030)
pH: 5 (ref 5.0–8.0)

## 2017-10-31 LAB — FOLATE: FOLATE: 76 ng/mL (ref >5.9)

## 2017-10-31 LAB — TSH: TSH: 3.274 u[IU]/mL (ref 0.350–4.500)

## 2017-10-31 LAB — BLOOD GAS, VENOUS
Acid-base deficit: 2.6 mmol/L — ABNORMAL HIGH (ref 0.0–2.0)
Bicarbonate: 23.2 mmol/L (ref 20.0–28.0)
FIO2: 0.21
PATIENT TEMPERATURE: 37
PCO2 VEN: 43 mmHg — AB (ref 44.0–60.0)
PH VEN: 7.34 (ref 7.250–7.430)

## 2017-10-31 LAB — URINE DRUG SCREEN, QUALITATIVE (ARMC ONLY)
Amphetamines, Ur Screen: NOT DETECTED
Barbiturates, Ur Screen: NOT DETECTED
Benzodiazepine, Ur Scrn: NOT DETECTED
CANNABINOID 50 NG, UR ~~LOC~~: NOT DETECTED
COCAINE METABOLITE, UR ~~LOC~~: NOT DETECTED
MDMA (ECSTASY) UR SCREEN: NOT DETECTED
Methadone Scn, Ur: NOT DETECTED
Opiate, Ur Screen: NOT DETECTED
PHENCYCLIDINE (PCP) UR S: NOT DETECTED
Tricyclic, Ur Screen: NOT DETECTED

## 2017-10-31 LAB — PROTIME-INR
INR: 1.39
Prothrombin Time: 16.9 seconds — ABNORMAL HIGH (ref 11.4–15.2)

## 2017-10-31 LAB — VITAMIN B12: VITAMIN B 12: 1254 pg/mL — AB (ref 180–914)

## 2017-10-31 LAB — ETHANOL: Alcohol, Ethyl (B): <10 mg/dL (ref <10)

## 2017-10-31 LAB — LACTIC ACID, PLASMA: Lactic Acid, Venous: 1.7 mmol/L (ref 0.5–1.9)

## 2017-10-31 MED ORDER — ONDANSETRON HCL 4 MG PO TABS
4.0000 mg | ORAL_TABLET | Freq: Four times a day (QID) | ORAL | Status: DC | PRN
  Administered 2017-11-01 – 2017-11-03 (×2): 4 mg via ORAL
  Filled 2017-10-31 (×2): qty 1

## 2017-10-31 MED ORDER — MORPHINE SULFATE (PF) 4 MG/ML IV SOLN
4.0000 mg | INTRAVENOUS | Status: DC | PRN
  Administered 2017-10-31 – 2017-11-01 (×3): 4 mg via INTRAVENOUS
  Filled 2017-10-31 (×4): qty 1

## 2017-10-31 MED ORDER — ADULT MULTIVITAMIN W/MINERALS CH
1.0000 | ORAL_TABLET | Freq: Every day | ORAL | Status: DC
  Administered 2017-10-31 – 2017-11-07 (×8): 1 via ORAL
  Filled 2017-10-31 (×8): qty 1

## 2017-10-31 MED ORDER — VITAMIN B-1 100 MG PO TABS
100.0000 mg | ORAL_TABLET | Freq: Every day | ORAL | Status: DC
  Administered 2017-10-31 – 2017-11-07 (×8): 100 mg via ORAL
  Filled 2017-10-31 (×8): qty 1

## 2017-10-31 MED ORDER — BISACODYL 5 MG PO TBEC: 5.0000 mg | DELAYED_RELEASE_TABLET | Freq: Every day | ORAL | Status: DC | PRN

## 2017-10-31 MED ORDER — FOLIC ACID 1 MG PO TABS
1.0000 mg | ORAL_TABLET | Freq: Every day | ORAL | Status: DC
  Administered 2017-10-31 – 2017-11-07 (×8): 1 mg via ORAL
  Filled 2017-10-31 (×8): qty 1

## 2017-10-31 MED ORDER — VITAMIN B-12 1000 MCG PO TABS
1000.0000 ug | ORAL_TABLET | Freq: Every day | ORAL | Status: DC
  Administered 2017-10-31 – 2017-11-07 (×8): 1000 ug via ORAL
  Filled 2017-10-31 (×8): qty 1

## 2017-10-31 MED ORDER — ALBUTEROL SULFATE (2.5 MG/3ML) 0.083% IN NEBU
2.5000 mg | INHALATION_SOLUTION | Freq: Four times a day (QID) | RESPIRATORY_TRACT | Status: DC | PRN
  Administered 2017-11-02: 2.5 mg via RESPIRATORY_TRACT
  Filled 2017-10-31: qty 3

## 2017-10-31 MED ORDER — MORPHINE SULFATE (PF) 2 MG/ML IV SOLN
2.0000 mg | INTRAVENOUS | Status: DC | PRN
  Administered 2017-11-01: 2 mg via INTRAVENOUS
  Filled 2017-10-31: qty 1

## 2017-10-31 MED ORDER — ENOXAPARIN SODIUM 40 MG/0.4ML ~~LOC~~ SOLN
40.0000 mg | SUBCUTANEOUS | Status: DC
  Administered 2017-10-31: 40 mg via SUBCUTANEOUS
  Filled 2017-10-31: qty 0.4

## 2017-10-31 MED ORDER — IOPAMIDOL (ISOVUE-300) INJECTION 61%
100.0000 mL | Freq: Once | INTRAVENOUS | Status: AC | PRN
  Administered 2017-10-31: 100 mL via INTRAVENOUS

## 2017-10-31 MED ORDER — ONDANSETRON HCL 4 MG/2ML IJ SOLN
4.0000 mg | Freq: Four times a day (QID) | INTRAMUSCULAR | Status: DC | PRN
  Administered 2017-10-31 – 2017-11-02 (×2): 4 mg via INTRAVENOUS
  Filled 2017-10-31 (×2): qty 2

## 2017-10-31 MED ORDER — SENNOSIDES-DOCUSATE SODIUM 8.6-50 MG PO TABS: 1.0000 | ORAL_TABLET | Freq: Every evening | ORAL | Status: DC | PRN

## 2017-10-31 NOTE — Progress Notes (Signed)
CODE SEPSIS - PHARMACY COMMUNICATION  **Broad Spectrum Antibiotics should be administered within 1 hour of Sepsis diagnosis**  Time Code Sepsis Called/Page Received: n/a  Antibiotics Ordered: ceftriaxone  Time of 1st antibiotic administration: 0049  Additional action taken by pharmacy:   If necessary, Name of Provider/Nurse Contacted:     Tobie Lords ,PharmD Clinical Pharmacist  10/31/2017  7:10 AM

## 2017-10-31 NOTE — ED Notes (Signed)
Patient transported to CT 

## 2017-10-31 NOTE — Progress Notes (Addendum)
Initial Nutrition Assessment  DOCUMENTATION CODES:   Severe malnutrition in context of chronic illness  INTERVENTION:   Ensure Enlive po TID, each supplement provides 350 kcal and 20 grams of protein (vanilla)   Magic cup TID with meals, each supplement provides 290 kcal and 9 grams of protein (vanilla)  Recommend change diet from heart healthy to Dysphagia 3  NUTRITION DIAGNOSIS:   Severe Malnutrition related to chronic illness(stomach mass with possible liver mets, possible chronic liver disease, alchohol abuse) as evidenced by severe muscle depletion, severe fat depletion.  GOAL:   Patient will meet greater than or equal to 90% of their needs  MONITOR:   PO intake, Supplement acceptance, Labs, I & O's, Weight trends  REASON FOR ASSESSMENT:   Malnutrition Screening Tool   ASSESSMENT:  62 y.o. Male with hx of malnutrition, alcohol abuse, sepsis, altered mental status presents to ED with reports of abdominal pain, daily N&V, and significant weight loss. Admitted with acute encephalopathy, and stomach mass with possible liver mets discovered.    Met with pt at bedside today. Pt is poor historian d/t altered mental status. When asked questions, he takes a long time to respond.   Pt reports an okay appetite. At time of visit, pt lunch tray was on bedside table with a couple of bites taken. He ate a little bit of frosted flakes and reports liking cereal. PTA, pt reports mostly eating out and usually consuming 1 meal/day. Pt states "sometimes I eat, and sometimes I don't." Pt reports living with a friend. When asked if patient has any barriers to obtaining food, pt did not respond.  Pt is missing several teeth and reports "I eat what I can." Recommend changing diet from heart healthy to Dysphagia 3.   Noted patients severe muscle and fat depletions. RD will order Ensure TID (vanilla) and magic cup TID (vanilla) to encourage increased protein and calorie intake.  Pt reported  significant weight loss upon admission. Unable to determine weight loss per patient chart. At time of visit, bed weight was 78.6 lbs. Dietetic Intern suspects patient has had significant weight loss.  Medications reviewed and include: folic acid 1 mg, MVI w/ minerals, thiamine 100 mg, vitamin B-12 1,000 mcg, morphine, ondansetron  Labs reviewed: From 4/24: K 4.4 wnl, alkaline phosphatase 260(H), AST 244(H), ALT 110(H), WBC 8.7 wnl, glucose 118 Vitamin B12 1254(H)  NUTRITION - FOCUSED PHYSICAL EXAM:    Most Recent Value  Orbital Region  Moderate depletion  Upper Arm Region  Severe depletion  Thoracic and Lumbar Region  Severe depletion  Buccal Region  Severe depletion  Temple Region  Severe depletion  Clavicle Bone Region  Severe depletion  Clavicle and Acromion Bone Region  Severe depletion  Scapular Bone Region  Severe depletion  Dorsal Hand  Moderate depletion  Patellar Region  Severe depletion  Anterior Thigh Region  Severe depletion  Posterior Calf Region  Severe depletion  Edema (RD Assessment)  None  Hair  Reviewed  Eyes  Reviewed  Mouth  Reviewed [missing teeth]  Skin  Reviewed  Nails  Reviewed      Diet Order:  Diet Heart Room service appropriate? Yes; Fluid consistency: Thin Diet NPO time specified  EDUCATION NEEDS:   Not appropriate for education at this time  Skin:  Skin Assessment: Reviewed RN Assessment  Last BM:  10/29/17(per nurse)  Height:   Ht Readings from Last 1 Encounters:  10/31/17 5' 2" (1.575 m)    Weight:   Wt Readings from   Last 1 Encounters:  10/31/17 112 lb 8 oz (51 kg)    Ideal Body Weight:  53.6 kg  BMI:  Body mass index is 20.58 kg/m.  Estimated Nutritional Needs:   Kcal:  1700-1950 kcal (MSJ ABW x 1.4-1.6)  Protein:  76-87 g/kg/day (ABW x 1.5-1.7)  Fluid:  >1.6 L/day (30 mL/kg/day)   , MS Dietetic Intern [phone]  

## 2017-10-31 NOTE — Plan of Care (Signed)
  Problem: Clinical Measurements: Goal: Ability to maintain clinical measurements within normal limits will improve Outcome: Progressing Goal: Will remain free from infection Outcome: Progressing Goal: Respiratory complications will improve Outcome: Progressing Goal: Cardiovascular complication will be avoided Outcome: Progressing   Problem: Activity: Goal: Risk for activity intolerance will decrease Outcome: Progressing   Problem: Coping: Goal: Level of anxiety will decrease Outcome: Progressing   Problem: Elimination: Goal: Will not experience complications related to bowel motility Outcome: Progressing Goal: Will not experience complications related to urinary retention Outcome: Progressing Note:  voiding   Problem: Safety: Goal: Ability to remain free from injury will improve Outcome: Progressing

## 2017-10-31 NOTE — Consult Note (Signed)
Hematology/Oncology Consult note [facility] Telephone:([phone] [phone]  Patient Care Team: Patient, No Pcp Per as PCP - General (General Practice)   Name of the patient: Nathan Mcintosh  [id number]  16-Jul-1955    Reason for consult: abdominal mass and peritoneal met   Referring physician- Dr. Margaretmary Eddy  Date of visit: 10/31/2017    History of presenting illness-patient is a 62 year old male with a past medical history significant for hypertension tobacco and alcohol abuse.  He presented to the ER today with symptoms of fatigue, weight loss generalized weakness and ongoing falls.  Also reported ongoing abdominal pain especially in his right upper quadrant along with nausea and vomiting.  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  Separate peritoneal metastatic mass within the pelvis adjacent to the rectum measuring 5.5 x 5.8 x 5.5 cm.  Differential diagnoses include lymphoma versus exophytic gastric carcinoma versus sarcoma versus gist tumor  CMP showed elevated AST and ALT of 2 4410 respectively and elevated alkaline phosphatase of 260.Albumin was low at 2.5.  Total bilirubin was normal at 0.8.  Blood cultures so far have shown no growth.CBC showed white count of 8.7, H&H of 12.5/29.3 with an MCV of 79.3 and a platelet count of 195.  Folate and TSH was normal B12 was elevated.  Lactic acid was high on presentation and normal today.  PT/INR elevated at 16.9/1.3.  Ethanol level less than 10  Patient is a poor historian. Reports abdominal pain. States that he lives with his friend. Daughter tara is involved in his care    ECOG PS- 2  Pain scale- 6   Review of systems- Review of Systems  Constitutional: Positive for malaise/fatigue and weight loss. Negative for chills and fever.  HENT: Negative for congestion, ear discharge and  nosebleeds.   Eyes: Negative for blurred vision.  Respiratory: Negative for cough, hemoptysis, sputum production, shortness of breath and wheezing.   Cardiovascular: Negative for chest pain, palpitations, orthopnea and claudication.  Gastrointestinal: Positive for abdominal pain, nausea and vomiting. Negative for blood in stool, constipation, diarrhea, heartburn and melena.  Genitourinary: Negative for dysuria, flank pain, frequency, hematuria and urgency.  Musculoskeletal: Negative for back pain, joint pain and myalgias.  Skin: Negative for rash.  Neurological: Negative for dizziness, tingling, focal weakness, seizures, weakness and headaches.  Endo/Heme/Allergies: Does not bruise/bleed easily.  Psychiatric/Behavioral: Negative for depression and suicidal ideas. The patient does not have insomnia.     No Known Allergies  Patient Active Problem List   Diagnosis Date Noted  . Abdominal pain 10/31/2017  . Sepsis ([place]) 05/06/2017  . 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 Severe major depression with psychotic features ([place]) 12/24/2014  . Alcohol abuse 12/24/2014  . Malnutrition ([place]) 12/24/2014     History reviewed. No pertinent past medical history.   History reviewed. No pertinent surgical history.  Social History   Socioeconomic History  . Marital status: Single    Spouse name: Not on file  . Number of children: Not on file  . Years of education: Not on file  . Highest education level: Not on file  Occupational History  . Not on file  Social Needs  . Financial resource strain: Not on file  . Food insecurity:    Worry: Not on file    Inability: Not on file  .  Transportation needs:    Medical: Not on file    Non-medical: Not on file  Tobacco Use  . Smoking status: Former Smoker    Packs/day: 1.00    Years: 30.00    Pack years: 30.00    Last attempt to quit: 12/25/2014    Years since quitting: 2.8  . Smokeless tobacco: Never  Used  Substance and Sexual Activity  . Alcohol use: Not Currently    Alcohol/week: 2.4 - 3.0 oz    Types: 4 - 5 Cans of beer per week  . Drug use: No  . Sexual activity: Not Currently  Lifestyle  . Physical activity:    Days per week: Not on file    Minutes per session: Not on file  . Stress: Not on file  Relationships  . Social connections:    Talks on phone: Not on file    Gets together: Not on file    Attends religious service: Not on file    Active member of club or organization: Not on file    Attends meetings of clubs or organizations: Not on file    Relationship status: Not on file  . Intimate partner violence:    Fear of current or ex partner: Not on file    Emotionally abused: Not on file    Physically abused: Not on file    Forced sexual activity: Not on file  Other Topics Concern  . Not on file  Social History Narrative  . Not on file     History reviewed. No pertinent family history.   Current Facility-Administered Medications:  .  albuterol (PROVENTIL) (2.5 MG/3ML) 0.083% nebulizer solution 2.5 mg, 2.5 mg, Nebulization, Q6H PRN, Jodell Cipro, Prasanna, MD .  bisacodyl (DULCOLAX) EC tablet 5 mg, 5 mg, Oral, Daily PRN, Arta Silence, MD .  folic acid (FOLVITE) tablet 1 mg, 1 mg, Oral, Daily, Jodell Cipro, Prasanna, MD, 1 mg at 10/31/17 1124 .  morphine 2 MG/ML injection 2 mg, 2 mg, Intravenous, Q3H PRN **OR** morphine 4 MG/ML injection 4 mg, 4 mg, Intravenous, Q3H PRN, Arta Silence, MD, 4 mg at 10/31/17 1415 .  multivitamin with minerals tablet 1 tablet, 1 tablet, Oral, Daily, Arta Silence, MD, 1 tablet at 10/31/17 1124 .  ondansetron (ZOFRAN) tablet 4 mg, 4 mg, Oral, Q6H PRN **OR** ondansetron (ZOFRAN) injection 4 mg, 4 mg, Intravenous, Q6H PRN, Jodell Cipro, Prasanna, MD, 4 mg at 10/31/17 1415 .  senna-docusate (Senokot-S) tablet 1 tablet, 1 tablet, Oral, QHS PRN, Jodell Cipro, Prasanna, MD .  thiamine (VITAMIN B-1) tablet 100 mg, 100 mg, Oral, Daily,  Jodell Cipro, Prasanna, MD, 100 mg at 10/31/17 1124 .  vitamin B-12 (CYANOCOBALAMIN) tablet 1,000 mcg, 1,000 mcg, Oral, Daily, Arta Silence, MD, 1,000 mcg at 10/31/17 1124   Physical exam:  Vitals:   10/31/17 0147 10/31/17 0316 10/31/17 0500 10/31/17 1122  BP: (!) 136/99 (!) 132/97 (!) 132/98 123/83  Pulse:  73 81 95  Resp:  20 18 18   Temp:  97.7 F (36.5 C) 97.7 F (36.5 C) 98.6 F (37 C)  TempSrc:  Oral Oral Oral  SpO2:  100% 100% 98%  Weight:  112 lb 8 oz (51 kg)    Height:  5' 2"  (1.575 m)     Physical Exam  Constitutional: He is oriented to person, place, and time.  He is thin and cachectic  HENT:  Head: Normocephalic and atraumatic.  Eyes: Pupils are equal, round, and reactive to light. EOM are normal.  Neck: Normal range of motion.  Cardiovascular: Normal rate, regular rhythm and normal heart sounds.  Pulmonary/Chest: Effort normal and breath sounds normal.  Abdominal: Soft. Bowel sounds are normal.  TTP RUQ. There is a palpable mass in the RUQ/ epigastrium. Difficult to ascertain the extent given significant guarding by the patient  Lymphadenopathy:  No palpable axillary, supraclavicular, cervical or inguinal adenopathy  Neurological: He is alert and oriented to person, place, and time.  Skin: Skin is warm and dry.       CMP Latest Ref Rng & Units 10/30/2017  Glucose 65 - 99 mg/dL 118(H)  BUN 6 - 20 mg/dL 21(H)  Creatinine 0.61 - 1.24 mg/dL 0.72  Sodium 135 - 145 mmol/L 139  Potassium 3.5 - 5.1 mmol/L 4.4  Chloride 101 - 111 mmol/L 104  CO2 22 - 32 mmol/L 22  Calcium 8.9 - 10.3 mg/dL 8.5(L)  Total Protein 6.5 - 8.1 g/dL 7.4  Total Bilirubin 0.3 - 1.2 mg/dL 0.8  Alkaline Phos 38 - 126 U/L 260(H)  AST 15 - 41 U/L 244(H)  ALT 17 - 63 U/L 110(H)   CBC Latest Ref Rng & Units 10/30/2017  WBC 3.8 - 10.6 K/uL 8.7  Hemoglobin 13.0 - 18.0 g/dL 12.5(L)  Hematocrit 40.0 - 52.0 % 39.3(L)  Platelets 150 - 440 K/uL 195    @IMAGES @  Ct Head Wo  Contrast  Result Date: 10/31/2017 CLINICAL DATA:  Alcohol abuse and malnutrition. Severe weight loss, nausea and vomiting. EXAM: CT HEAD WITHOUT CONTRAST TECHNIQUE: Contiguous axial images were obtained from the base of the skull through the vertex without intravenous contrast. COMPARISON:  None. FINDINGS: BRAIN: There is sulcal and ventricular prominence consistent with superficial and central atrophy. No intraparenchymal hemorrhage, mass effect nor midline shift. Periventricular and subcortical white matter hypodensities consistent with chronic small vessel ischemic disease are identified. No acute large vascular territory infarcts. No abnormal extra-axial fluid collections. Basal cisterns are not effaced and midline. VASCULAR: Moderate calcific atherosclerosis of the carotid siphons. SKULL: No skull fracture. No significant scalp soft tissue swelling. SINUSES/ORBITS: The mastoid air-cells are clear. The included paranasal sinuses are well-aerated.The included ocular globes and orbital contents are non-suspicious. OTHER: None. IMPRESSION: Atrophy with chronic appearing small vessel ischemic disease. No acute intracranial abnormality. Electronically Signed   By: Ashley Royalty M.D.   On: 10/31/2017 00:42   Ct Abdomen Pelvis W Contrast  Result Date: 10/31/2017 CLINICAL DATA:  Alcohol abuse, malnutrition and abdominal pain. EXAM: CT ABDOMEN AND PELVIS WITH CONTRAST TECHNIQUE: Multidetector CT imaging of the abdomen and pelvis was performed using the standard protocol following bolus administration of intravenous contrast. CONTRAST:  175m ISOVUE-300 IOPAMIDOL (ISOVUE-300) INJECTION 61% COMPARISON:  02/11/2014 CT FINDINGS: Lower chest: The included heart size is normal. No pericardial effusion. Lung bases are clear. No effusion. There is a small hiatal hernia Hepatobiliary: There are numerous ill-defined and heterogeneously enhancing masses of the liver consistent with metastasis. No biliary dilatation. No portal  or splenic vein thrombosis. The gallbladder is free of stones and physiologically distended without focal mural thickening. Pancreas: Atretic pancreas without ductal dilatation. Spleen: No splenomegaly.  No definite mass. Adrenals/Urinary Tract: Mild fullness of the left adrenal apex. No discrete adrenal mass. Bilateral renal cysts without nephrolithiasis hydroureteronephrosis. 2.7 cm left upper pole and 0.9 cm right upper pole renal cysts. Nondistended urinary bladder with mild circumferential mural thickening likely due to underdistention, less likely cystitis. Stomach/Bowel: Bulky exophytic enhancing mass off the expected location of the distal body and antrum of the stomach with areas of central  necrosis is identified, measuring roughly 10.7 x 9.9 x 7.9 cm. No gastric outlet obstruction is identified. Contrast extends into the small intestine without obstruction. What is visualized of the colon is unremarkable. Separate pelvic peritoneal necrotic mass to the right of the rectum with eccentric mural thickening involving the left lateral wall of this lesion and predominately necrotic or cystic component on the right overall measures 5.5 x 5.8 x 5.5 cm. Vascular/Lymphatic: No aortic aneurysm or dissection. Difficult to appreciate lymphadenopathy due to patient CT axial due to the bulky nature of aforementioned mass. Reproductive: Normal size prostate. Other: No hernia. No free air. Small amount of free fluid in the pelvis and abdomen. Musculoskeletal: No aggressive osseous lesions. IMPRESSION: 1. Necrotic bulky exophytic appearing mass off the stomach measuring 10.7 x 9.9 x 7.9 cm with associated metastatic disease to the liver. Separate peritoneal metastatic mass deep within the pelvis adjacent to the rectum measuring 5.5 x 5.8 x 5.5 cm. 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 Other differential possibilities that may include lymphoma, gastric lymphoma, exophytic gastric  carcinoma, or potentially soft tissue sarcoma invading stomach. Given the exophytic hypervascular, centrally necrotic appearance of this lesion off the stomach, favor a GIST tumor. 2. Simple bilateral renal cysts. Electronically Signed   By: Ashley Royalty M.D.   On: 10/31/2017 01:05   Dg Chest Portable 1 View  Result Date: 10/30/2017 CLINICAL DATA:  Cough EXAM: PORTABLE CHEST 1 VIEW COMPARISON:  05/06/2017 FINDINGS: Hyperinflation. No acute consolidation or effusion. Normal heart size. No pneumothorax. IMPRESSION: No active disease.  Hyperinflation. Electronically Signed   By: Donavan Foil M.D.   On: 10/30/2017 21:23    Assessment and plan- Patient is a 62 y.o. male who presented with abdominal pain found to have an exophytic large gastric mass about 10 cm with liver metastases and peritoneal met  I have reviewed CT abdomen and pelvis images independently and discussed the findings with patient and family.  Mass appears submucosal and is exophytic.  Therefore not amenable to biopsy by endoscopy.  IR will be doing an ultrasound-guided liver biopsy in a.m.    I will order ferritin and iron studies along with LDH as well as hepatitis B and hepatitis C testing.  HIV testing is currently pending.  Depending on the results of the biopsy he would need further tumor marker testing and possibly PET CT scan as an outpatient.  I will discuss pathology results with him as an outpatient and discuss further management at that time  Neoplasm related pain: Recommend starting him on oral PRN pain medicine suggest oxycodone 5 mg every 4 hours as needed for pain.  If pain is still not controlled he may need a long-acting pain medicine as well.  He will need bowel meds to prevent opioid-induced constipation  Abnormal LFTs: Patient does not have an elevated bilirubin but does have an elevated PT/INR he may have some evidence of chronic liver disease due to alcohol especially given his hypoalbuminemia and elevated PT/INR  although he does not have any elevated bilirubin.  Continue to monitor  I will touch base with his daughter [name] tomorrow  Thank you for this kind referral and the opportunity to participate in the care of this patient   Visit Diagnosis 1. Right upper quadrant abdominal pain   2. Nausea and vomiting, intractability of vomiting not specified, unspecified vomiting type     Dr. Randa Evens, MD, MPH [facility] [phone] 10/31/2017  4:24 PM

## 2017-10-31 NOTE — Progress Notes (Signed)
Family Meeting Note  Advance Directive:yes  Today a meeting took place with the Patient.    The following clinical team members were present during this meeting:MD ,RN  The following were discussed:Patient's diagnosis: Acute abdominal pain with nausea vomiting, stomach mass with possible liver mets, cachexia, hypertension, treatment plan of care discussed in detail with the patient.  He verbalized understanding , Patient's progosis: Unable to determine and Goals for treatment: Full Code, daughter [name] is  the healthcare POA  Additional follow-up to be provided: Hospitalist, oncology, gastroenterology  Time spent during discussion:18 MIN  Nicholes Mango, MD

## 2017-10-31 NOTE — H&P (Signed)
[facility] [place] NAME: Nathan Mcintosh    MR#:  [id number]  DATE OF BIRTH:  05/22/1956  DATE OF ADMISSION:  10/30/2017  PRIMARY CARE PHYSICIAN: Patient, No Pcp Per   REQUESTING/REFERRING PHYSICIAN: Darel Hong, MD  CHIEF COMPLAINT:   Chief Complaint  Patient presents with  . Weakness    HISTORY OF PRESENT ILLNESS:  Nathan Mcintosh  is a 62 y.o. male with a known history of HTN, EtOH abuse, tobacco use D/O, malnourishment/cachexia who p/w AMS. The pt is alert to person and place, but not to month or year. He is a poor historian, and is unable to provide accurate timeframe of symptoms. Pt endorses fatigue/malaise/generalized weakness, unsteady gait, lightheadedness on standing, and falls. At first he states this has been going on for several weeks, but then he states it has been going on for several months. He initially endorsed LOC, but then denied LOC later. He endorses decreased exercise tolerance x~7mo, and states he has been spending more time lying down. He endorses diffuse AP, worst in the RUQ, and daily N/V, which he also believes has been ongoing x~66mo. He endorses decreased PO intake of food and weight loss, but states he has been drinking soda and water. He states he has been living with a friend for the past 2-40mo. Prior to that, he says he was living with another friend, but that friend moved. He states he used to smoke and drink, but does not anymore. He initially said he quit drinking ~1wk ago, but then said he quit drinking ~1-20mo ago. He has not noticed any jaundice/icterus or edema. Pt denies F/C, diarrhea, CP, SOB, cough, hemoptysis, palpitations, diaphoresis, night sweats, rigors, HA, blurred vision, vertigo, urinary symptoms. He is unable to provide any further relevant information.  PAST MEDICAL HISTORY:  History reviewed. No pertinent past medical history.   HTN, EtOH abuse, tobacco use D/O,  malnourishment/cachexia PAST SURGICAL HISTORY:  History reviewed. No pertinent surgical history.  SOCIAL HISTORY:   Social History   Tobacco Use  . Smoking status: Former Smoker    Packs/day: 1.00    Years: 30.00    Pack years: 30.00    Last attempt to quit: 12/25/2014    Years since quitting: 2.8  . Smokeless tobacco: Never Used  Substance Use Topics  . Alcohol use: Not Currently    Alcohol/week: 2.4 - 3.0 oz    Types: 4 - 5 Cans of beer per week    FAMILY HISTORY:  History reviewed. No pertinent family history.  DRUG ALLERGIES:  No Known Allergies  REVIEW OF SYSTEMS:   Review of Systems  Constitutional: Positive for malaise/fatigue and weight loss. Negative for chills, diaphoresis and fever.  HENT: Negative for congestion, hearing loss, sore throat and tinnitus.   Eyes: Negative for blurred vision, double vision, photophobia, pain, discharge and redness.  Respiratory: Negative for cough, hemoptysis, sputum production, shortness of breath and wheezing.   Cardiovascular: Negative for chest pain, palpitations, orthopnea, claudication, leg swelling and PND.  Gastrointestinal: Positive for abdominal pain, nausea and vomiting. Negative for blood in stool, constipation, diarrhea, heartburn and melena.  Genitourinary: Negative for dysuria, frequency, hematuria and urgency.  Musculoskeletal: Negative for back pain, joint pain and neck pain.  Skin: Negative for itching and rash.  Neurological: Positive for dizziness and weakness (+) generalized weakness. Negative for tingling, tremors, sensory change, speech change, focal weakness, seizures, loss of consciousness and headaches.    MEDICATIONS AT HOME:  Prior to Admission medications   Medication Sig Start Date End Date Taking? Authorizing Provider  albuterol (PROVENTIL HFA;VENTOLIN HFA) 108 (90 Base) MCG/ACT inhaler Inhale 2 puffs into the lungs every 4 (four) hours as needed for wheezing. 09/12/16  Yes Marylene Land, NP   amoxicillin-clavulanate (AUGMENTIN) 875-125 MG tablet Take 1 tablet by mouth every 12 (twelve) hours. Patient not taking: Reported on 10/30/2017 05/08/17   Fritzi Mandes, MD  metoprolol tartrate (LOPRESSOR) 25 MG tablet Take 1 tablet (25 mg total) by mouth 2 (two) times daily. Patient not taking: Reported on 10/30/2017 05/08/17   Fritzi Mandes, MD      VITAL SIGNS:  Blood pressure (!) 136/99, pulse 85, resp. rate (!) 28, weight 52.2 kg (115 lb), SpO2 100 %.  PHYSICAL EXAMINATION:  Physical Exam  Constitutional: He appears well-developed. He appears cachectic. He is cooperative.  Non-toxic appearance. He has a sickly appearance. No distress.  HENT:  Head: Normocephalic and atraumatic.  Eyes: Pupils are equal, round, and reactive to light. EOM are normal. No scleral icterus.  Neck: Neck supple. No JVD present. No thyromegaly present.  Cardiovascular: Normal rate, regular rhythm, S1 normal, S2 normal and normal heart sounds.  No extrasystoles are present. Exam reveals no gallop, no S3, no S4, no distant heart sounds and no friction rub.  No murmur heard. Pulmonary/Chest: Effort normal. No stridor. No respiratory distress. He has decreased breath sounds in the right upper field, the right middle field, the right lower field, the left upper field, the left middle field and the left lower field. He has no wheezes. He has no rhonchi. He has no rales.  Abdominal: Soft. He exhibits no distension, no fluid wave, no ascites and no mass. Bowel sounds are decreased. There is generalized tenderness and tenderness in the right upper quadrant. There is no rebound and no guarding.  Musculoskeletal: Normal range of motion. He exhibits no edema.  Lymphadenopathy:    He has no cervical adenopathy.  Neurological: He is alert. He is disoriented. He displays no tremor. No cranial nerve deficit or sensory deficit. He exhibits normal muscle tone.  Oriented to person and place (not time).  Skin: Skin is warm and dry. No  rash noted. He is not diaphoretic. No erythema.    LABORATORY PANEL:   CBC Recent Labs  Lab 10/30/17 2212  WBC 8.7  HGB 12.5*  HCT 39.3*  PLT 195   ------------------------------------------------------------------------------------------------------------------  Chemistries  Recent Labs  Lab 10/30/17 2212  NA 139  K 4.4  CL 104  CO2 22  GLUCOSE 118*  BUN 21*  CREATININE 0.72  CALCIUM 8.5*  AST 244*  ALT 110*  ALKPHOS 260*  BILITOT 0.8   ------------------------------------------------------------------------------------------------------------------  Cardiac Enzymes Recent Labs  Lab 10/30/17 2212  TROPONINI <0.03   ------------------------------------------------------------------------------------------------------------------  RADIOLOGY:  Ct Head Wo Contrast  Result Date: 10/31/2017 CLINICAL DATA:  Alcohol abuse and malnutrition. Severe weight loss, nausea and vomiting. EXAM: CT HEAD WITHOUT CONTRAST TECHNIQUE: Contiguous axial images were obtained from the base of the skull through the vertex without intravenous contrast. COMPARISON:  None. FINDINGS: BRAIN: There is sulcal and ventricular prominence consistent with superficial and central atrophy. No intraparenchymal hemorrhage, mass effect nor midline shift. Periventricular and subcortical white matter hypodensities consistent with chronic small vessel ischemic disease are identified. No acute large vascular territory infarcts. No abnormal extra-axial fluid collections. Basal cisterns are not effaced and midline. VASCULAR: Moderate calcific atherosclerosis of the carotid siphons. SKULL: No skull fracture. No significant scalp soft tissue  swelling. SINUSES/ORBITS: The mastoid air-cells are clear. The included paranasal sinuses are well-aerated.The included ocular globes and orbital contents are non-suspicious. OTHER: None. IMPRESSION: Atrophy with chronic appearing small vessel ischemic disease. No acute intracranial  abnormality. Electronically Signed   By: Ashley Royalty M.D.   On: 10/31/2017 00:42   Ct Abdomen Pelvis W Contrast  Result Date: 10/31/2017 CLINICAL DATA:  Alcohol abuse, malnutrition and abdominal pain. EXAM: CT ABDOMEN AND PELVIS WITH CONTRAST TECHNIQUE: Multidetector CT imaging of the abdomen and pelvis was performed using the standard protocol following bolus administration of intravenous contrast. CONTRAST:  196mL ISOVUE-300 IOPAMIDOL (ISOVUE-300) INJECTION 61% COMPARISON:  02/11/2014 CT FINDINGS: Lower chest: The included heart size is normal. No pericardial effusion. Lung bases are clear. No effusion. There is a small hiatal hernia Hepatobiliary: There are numerous ill-defined and heterogeneously enhancing masses of the liver consistent with metastasis. No biliary dilatation. No portal or splenic vein thrombosis. The gallbladder is free of stones and physiologically distended without focal mural thickening. Pancreas: Atretic pancreas without ductal dilatation. Spleen: No splenomegaly.  No definite mass. Adrenals/Urinary Tract: Mild fullness of the left adrenal apex. No discrete adrenal mass. Bilateral renal cysts without nephrolithiasis hydroureteronephrosis. 2.7 cm left upper pole and 0.9 cm right upper pole renal cysts. Nondistended urinary bladder with mild circumferential mural thickening likely due to underdistention, less likely cystitis. Stomach/Bowel: Bulky exophytic enhancing mass off the expected location of the distal body and antrum of the stomach with areas of central necrosis is identified, measuring roughly 10.7 x 9.9 x 7.9 cm. No gastric outlet obstruction is identified. Contrast extends into the small intestine without obstruction. What is visualized of the colon is unremarkable. Separate pelvic peritoneal necrotic mass to the right of the rectum with eccentric mural thickening involving the left lateral wall of this lesion and predominately necrotic or cystic component on the right overall  measures 5.5 x 5.8 x 5.5 cm. Vascular/Lymphatic: No aortic aneurysm or dissection. Difficult to appreciate lymphadenopathy due to patient CT axial due to the bulky nature of aforementioned mass. Reproductive: Normal size prostate. Other: No hernia. No free air. Small amount of free fluid in the pelvis and abdomen. Musculoskeletal: No aggressive osseous lesions. IMPRESSION: 1. Necrotic bulky exophytic appearing mass off the stomach measuring 10.7 x 9.9 x 7.9 cm with associated metastatic disease to the liver. Separate peritoneal metastatic mass deep within the pelvis adjacent to the rectum measuring 5.5 x 5.8 x 5.5 cm. 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 Other differential possibilities that may include lymphoma, gastric lymphoma, exophytic gastric carcinoma, or potentially soft tissue sarcoma invading stomach. Given the exophytic hypervascular, centrally necrotic appearance of this lesion off the stomach, favor a GIST tumor. 2. Simple bilateral renal cysts. Electronically Signed   By: Ashley Royalty M.D.   On: 10/31/2017 01:05   Dg Chest Portable 1 View  Result Date: 10/30/2017 CLINICAL DATA:  Cough EXAM: PORTABLE CHEST 1 VIEW COMPARISON:  05/06/2017 FINDINGS: Hyperinflation. No acute consolidation or effusion. Normal heart size. No pneumothorax. IMPRESSION: No active disease.  Hyperinflation. Electronically Signed   By: Donavan Foil M.D.   On: 10/30/2017 21:23    IMPRESSION AND PLAN:   A/P: 33M AMS/encephalopathy, stomach/liver mass.  1.) AMS/encephalopathy: Pt p/w AMS, AAOx2 (person & place). Otherwise nonfocal. Ammonia 24. Lactate 4.4, likely 2/2 liver disease, (-) F/WBC, SIRS (-), no obvious source of infxn. TSH, RPR, B12/folate levels pending. CT head (+) atrophy, (-) acute intracranial abnl. Encephalopathy possibly 2/2 long-term  EtOH abuse. Not in withdrawal. Thiamine, folate, B12, MVI. Additional findings/mgmt below.  2.) AP/N/V w/ stomach/liver mass:  Pt endorses ~66mo Hx chronic AP/N/V, loss of appetite, decreased PO intake, weight loss. CT A/P (+) stomach mass w/ associated liver + peritoneal mets, GIST vs. Lymphoma vs. other [place] 4.4, LFTs abnl (AST 244, ALT 110), INR 1.39, findings consistent w/ chronic liver disease. As noted in radiological report, high level of concern for intraabdominal malignancy. Prognosis expected to be poor. I explained the results to the pt as best I could, very frankly voicing my concern for active cancer in the process of doing so. The pt did not seem alarmed, and I am not entirely sure if he understood what I was trying to tell him. I expect we will need definitive pathological diagnosis prior to formulating a more comprehensive plan. Pt will also likely be a good candidate for Palliative Care. Pain ctrl, antiemetics.  3.) HTN: BP ok. Hold Lopressor.  4.) FEN/GI: Cardiac diet as tolerated.  5.) DVT PPx: Lovenox 40mg  SQ qD.  6.) Code status: Full code.  7.) Disposition: Admission, pt expected to stay > 2 midnights.   All the records are reviewed and case discussed with ED provider. Management plans discussed with the patient, family and they are in agreement.  CODE STATUS: Full code.  TOTAL TIME TAKING CARE OF THIS PATIENT: 90 minutes.    Arta Silence M.D on 10/31/2017 at 2:32 AM  Between 7am to 6pm - Pager - [phone]  After 6pm go to [email or web address] - Proofreader  [facility]  Office  [phone]  CC: Primary care physician; Patient, No Pcp Per   Note: This dictation was prepared with Dragon dictation along with smaller phrase technology. Any transcriptional errors that result from this process are unintentional.

## 2017-10-31 NOTE — ED Notes (Signed)
Notified lab at this time that they would need to send someone to collect blood sample for additional lab work d/t pt's poor vascular status and difficulty obtaining blood, per Lorriane Shire, primary RN. Lab tech acknowledged request.

## 2017-10-31 NOTE — Progress Notes (Addendum)
[facility] at [place] NAME: Nathan Mcintosh    MR#:  [id number]  DATE OF BIRTH:  1955/08/26  SUBJECTIVE:  CHIEF COMPLAINT: Patient is reporting ongoing abdominal pain for more than any other weeks he has been progressively getting worse and significant weight loss.  REVIEW OF SYSTEMS:  CONSTITUTIONAL: No fever, reporting fatigue and weight loss EYES: No blurred or double vision.  EARS, NOSE, AND THROAT: No tinnitus or ear pain.  RESPIRATORY: No cough, shortness of breath, wheezing or hemoptysis.  CARDIOVASCULAR: No chest pain, orthopnea, edema.  GASTROINTESTINAL: No nausea, vomiting, diarrhea.  Reporting upper abdominal pain.  GENITOURINARY: No dysuria, hematuria.  ENDOCRINE: No polyuria, nocturia,  HEMATOLOGY: No anemia, easy bruising or bleeding SKIN: No rash or lesion. MUSCULOSKELETAL: No joint pain or arthritis.   NEUROLOGIC: No tingling, numbness, weakness.  PSYCHIATRY: No anxiety or depression.   DRUG ALLERGIES:  No Known Allergies  VITALS:  Blood pressure 123/83, pulse 95, temperature 98.6 F (37 C), temperature source Oral, resp. rate 18, height 5\' 2"  (1.575 m), weight 51 kg (112 lb 8 oz), SpO2 98 %.  PHYSICAL EXAMINATION:  GENERAL:  62 y.o.-year-old patient lying in the bed with no acute distress.  Cachectic EYES: Pupils equal, round, reactive to light and accommodation. No scleral icterus. Extraocular muscles intact.  HEENT: Head atraumatic, normocephalic. Oropharynx and nasopharynx clear.  NECK:  Supple, no jugular venous distention. No thyroid enlargement, no tenderness.  LUNGS: Normal breath sounds bilaterally, no wheezing, rales,rhonchi or crepitation. No use of accessory muscles of respiration.  CARDIOVASCULAR: S1, S2 normal. No murmurs, rubs, or gallops.  ABDOMEN: Soft, epigastric tenderness is present no rebound tenderness, nondistended. Bowel sounds present. EXTREMITIES: No pedal edema, cyanosis, or clubbing.   NEUROLOGIC: Cranial nerves II through XII are intact. Muscle strength global weakness in all extremities. Sensation intact. Gait not checked.  PSYCHIATRIC: The patient is alert and oriented x 3.  SKIN: No obvious rash, lesion, or ulcer.    LABORATORY PANEL:   CBC Recent Labs  Lab 10/30/17 2212  WBC 8.7  HGB 12.5*  HCT 39.3*  PLT 195   ------------------------------------------------------------------------------------------------------------------  Chemistries  Recent Labs  Lab 10/30/17 2212  NA 139  K 4.4  CL 104  CO2 22  GLUCOSE 118*  BUN 21*  CREATININE 0.72  CALCIUM 8.5*  AST 244*  ALT 110*  ALKPHOS 260*  BILITOT 0.8   ------------------------------------------------------------------------------------------------------------------  Cardiac Enzymes Recent Labs  Lab 10/30/17 2212  TROPONINI <0.03   ------------------------------------------------------------------------------------------------------------------  RADIOLOGY:  Ct Head Wo Contrast  Result Date: 10/31/2017 CLINICAL DATA:  Alcohol abuse and malnutrition. Severe weight loss, nausea and vomiting. EXAM: CT HEAD WITHOUT CONTRAST TECHNIQUE: Contiguous axial images were obtained from the base of the skull through the vertex without intravenous contrast. COMPARISON:  None. FINDINGS: BRAIN: There is sulcal and ventricular prominence consistent with superficial and central atrophy. No intraparenchymal hemorrhage, mass effect nor midline shift. Periventricular and subcortical white matter hypodensities consistent with chronic small vessel ischemic disease are identified. No acute large vascular territory infarcts. No abnormal extra-axial fluid collections. Basal cisterns are not effaced and midline. VASCULAR: Moderate calcific atherosclerosis of the carotid siphons. SKULL: No skull fracture. No significant scalp soft tissue swelling. SINUSES/ORBITS: The mastoid air-cells are clear. The included paranasal sinuses  are well-aerated.The included ocular globes and orbital contents are non-suspicious. OTHER: None. IMPRESSION: Atrophy with chronic appearing small vessel ischemic disease. No acute intracranial abnormality. Electronically Signed   By: Meredith Leeds.D.  On: 10/31/2017 00:42   Ct Abdomen Pelvis W Contrast  Result Date: 10/31/2017 CLINICAL DATA:  Alcohol abuse, malnutrition and abdominal pain. EXAM: CT ABDOMEN AND PELVIS WITH CONTRAST TECHNIQUE: Multidetector CT imaging of the abdomen and pelvis was performed using the standard protocol following bolus administration of intravenous contrast. CONTRAST:  121mL ISOVUE-300 IOPAMIDOL (ISOVUE-300) INJECTION 61% COMPARISON:  02/11/2014 CT FINDINGS: Lower chest: The included heart size is normal. No pericardial effusion. Lung bases are clear. No effusion. There is a small hiatal hernia Hepatobiliary: There are numerous ill-defined and heterogeneously enhancing masses of the liver consistent with metastasis. No biliary dilatation. No portal or splenic vein thrombosis. The gallbladder is free of stones and physiologically distended without focal mural thickening. Pancreas: Atretic pancreas without ductal dilatation. Spleen: No splenomegaly.  No definite mass. Adrenals/Urinary Tract: Mild fullness of the left adrenal apex. No discrete adrenal mass. Bilateral renal cysts without nephrolithiasis hydroureteronephrosis. 2.7 cm left upper pole and 0.9 cm right upper pole renal cysts. Nondistended urinary bladder with mild circumferential mural thickening likely due to underdistention, less likely cystitis. Stomach/Bowel: Bulky exophytic enhancing mass off the expected location of the distal body and antrum of the stomach with areas of central necrosis is identified, measuring roughly 10.7 x 9.9 x 7.9 cm. No gastric outlet obstruction is identified. Contrast extends into the small intestine without obstruction. What is visualized of the colon is unremarkable. Separate pelvic  peritoneal necrotic mass to the right of the rectum with eccentric mural thickening involving the left lateral wall of this lesion and predominately necrotic or cystic component on the right overall measures 5.5 x 5.8 x 5.5 cm. Vascular/Lymphatic: No aortic aneurysm or dissection. Difficult to appreciate lymphadenopathy due to patient CT axial due to the bulky nature of aforementioned mass. Reproductive: Normal size prostate. Other: No hernia. No free air. Small amount of free fluid in the pelvis and abdomen. Musculoskeletal: No aggressive osseous lesions. IMPRESSION: 1. Necrotic bulky exophytic appearing mass off the stomach measuring 10.7 x 9.9 x 7.9 cm with associated metastatic disease to the liver. Separate peritoneal metastatic mass deep within the pelvis adjacent to the rectum measuring 5.5 x 5.8 x 5.5 cm. 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 Other differential possibilities that may include lymphoma, gastric lymphoma, exophytic gastric carcinoma, or potentially soft tissue sarcoma invading stomach. Given the exophytic hypervascular, centrally necrotic appearance of this lesion off the stomach, favor a GIST tumor. 2. Simple bilateral renal cysts. Electronically Signed   By: Ashley Royalty M.D.   On: 10/31/2017 01:05   Dg Chest Portable 1 View  Result Date: 10/30/2017 CLINICAL DATA:  Cough EXAM: PORTABLE CHEST 1 VIEW COMPARISON:  05/06/2017 FINDINGS: Hyperinflation. No acute consolidation or effusion. Normal heart size. No pneumothorax. IMPRESSION: No active disease.  Hyperinflation. Electronically Signed   By: Donavan Foil M.D.   On: 10/30/2017 21:23    EKG:   Orders placed or performed during the hospital encounter of 10/30/17  . ED EKG  . ED EKG    ASSESSMENT AND PLAN:     A/P: 55M AMS/encephalopathy, stomach/liver mass.  1.) Acute encephalopathy: Could be from alcohol abuse Resolved   Ammonia 24. Lactate 4.4 - 1.7 , likely 2/2 liver  disease  (-) F/WBC, SIRS (-), no obvious source of infxn.  TSH nml  RPR, B12/folate levels pending.  CT head (+) atrophy, (-) acute intracranial abnl.   Not in withdrawal. Thiamine, folate, B12, MVI.   2.)  Acute abdominal pain/N/V w/  stomach/liver mass:  Pt endorses several month history of abdominal pain associated with nausea vomiting weakness and loss of appetite with significant weight loss.  CT A/P (+) stomach mass w/ associated liver + peritoneal mets, GIST vs. Lymphoma vs.  Elevated lactate, LFTs abnl (AST 244, ALT 110), INR 1.39, findings consistent w/ chronic liver disease.   Prognosis expected to be poor.  Oncology consult placed, discussed with Dr. Janese Banks, recommending  EGD and biopsy of the mass, discussed with Dr. Vicente Males, he has discussed with radiologist Dr. Carman Ching, EGD is not a good option for the biopsy, canceled GI consult Consult placed to interventional radiology and discussed with Dr. Leafy Kindle who will do ultrasound-guided liver biopsy in a.m.  n.p.o. after midnight Call placed to patient's daughter [name] to call back to give an update  pt mightalso likely be a good candidate for Palliative Care.  Pain ctrl, antiemetics, supportive treatment  3.) HTN: BP ok. Hold Lopressor.  4.) FEN/GI: Cardiac diet as tolerated.  5.) DVT PPx: Lovenox 40mg  SQ qD.  Will be held tonight as patient is getting ultrasound-guided liver biopsy tomorrow after that we can resume Lovenox subcu  6.) Code status: Full code.  7.) Disposition: Will be decided tomorrow call placed to daughter.     All the records are reviewed and case discussed with Care Management/Social Workerr. Management plans discussed with the patient, granddaughter  And granddaughter will inform patient's daughter [name] to call us back for an update they are in agreement.  CODE STATUS: fc, daughter daughter is the healthcare POA  TOTAL TIME TAKING CARE OF THIS PATIENT: 36  minutes.   POSSIBLE D/C IN 2DAYS, DEPENDING  ON CLINICAL CONDITION.  Note: This dictation was prepared with Dragon dictation along with smaller phrase technology. Any transcriptional errors that result from this process are unintentional.   Nicholes Mango M.D on 10/31/2017 at 1:00 PM  Between 7am to 6pm - Pager - [phone] After 6pm go to [email or web address] - password [email or web address] [facility]  Office  [phone]  CC: Primary care physician; Patient, No Pcp Per

## 2017-11-01 ENCOUNTER — Inpatient Hospital Stay: Payer: Medicaid Other

## 2017-11-01 LAB — BLOOD CULTURE ID PANEL (REFLEXED)
ACINETOBACTER BAUMANNII: NOT DETECTED
CANDIDA ALBICANS: NOT DETECTED
CANDIDA KRUSEI: NOT DETECTED
Candida glabrata: NOT DETECTED
Candida parapsilosis: NOT DETECTED
Candida tropicalis: NOT DETECTED
ENTEROBACTER CLOACAE COMPLEX: NOT DETECTED
ENTEROBACTERIACEAE SPECIES: NOT DETECTED
ENTEROCOCCUS SPECIES: NOT DETECTED
ESCHERICHIA COLI: NOT DETECTED
Haemophilus influenzae: NOT DETECTED
KLEBSIELLA OXYTOCA: NOT DETECTED
Klebsiella pneumoniae: NOT DETECTED
LISTERIA MONOCYTOGENES: NOT DETECTED
METHICILLIN RESISTANCE: DETECTED — AB
Neisseria meningitidis: NOT DETECTED
PSEUDOMONAS AERUGINOSA: NOT DETECTED
Proteus species: NOT DETECTED
SERRATIA MARCESCENS: NOT DETECTED
STAPHYLOCOCCUS AUREUS BCID: NOT DETECTED
STREPTOCOCCUS AGALACTIAE: NOT DETECTED
STREPTOCOCCUS PNEUMONIAE: NOT DETECTED
STREPTOCOCCUS PYOGENES: NOT DETECTED
Staphylococcus species: DETECTED — AB
Streptococcus species: NOT DETECTED

## 2017-11-01 LAB — IRON AND TIBC
IRON: 8 ug/dL — AB (ref 45–182)
Saturation Ratios: 7 % — ABNORMAL LOW (ref 17.9–39.5)
TIBC: 109 ug/dL — ABNORMAL LOW (ref 250–450)
UIBC: 101 ug/dL

## 2017-11-01 LAB — RPR: RPR: NONREACTIVE

## 2017-11-01 LAB — LACTATE DEHYDROGENASE: LDH: 2153 U/L — AB (ref 98–192)

## 2017-11-01 LAB — HIV ANTIBODY (ROUTINE TESTING W REFLEX): HIV SCREEN 4TH GENERATION: NONREACTIVE

## 2017-11-01 LAB — FERRITIN: FERRITIN: 699 ng/mL — AB (ref 24–336)

## 2017-11-01 MED ORDER — FENTANYL CITRATE (PF) 100 MCG/2ML IJ SOLN
INTRAMUSCULAR | Status: AC
  Filled 2017-11-01: qty 4

## 2017-11-01 MED ORDER — SODIUM CHLORIDE 0.9 % IV SOLN
INTRAVENOUS | Status: DC
  Administered 2017-11-01: 09:00:00 via INTRAVENOUS

## 2017-11-01 MED ORDER — TRAMADOL HCL 50 MG PO TABS: 50.0000 mg | ORAL_TABLET | Freq: Four times a day (QID) | ORAL | Status: DC | PRN

## 2017-11-01 MED ORDER — SODIUM CHLORIDE 0.9 % IV SOLN
INTRAVENOUS | Status: DC
  Administered 2017-11-01 – 2017-11-02 (×2): via INTRAVENOUS

## 2017-11-01 MED ORDER — MORPHINE SULFATE (PF) 2 MG/ML IV SOLN
2.0000 mg | INTRAVENOUS | Status: DC | PRN
  Administered 2017-11-01: 2 mg via INTRAVENOUS
  Filled 2017-11-01 (×2): qty 1

## 2017-11-01 MED ORDER — ENOXAPARIN SODIUM 40 MG/0.4ML ~~LOC~~ SOLN
40.0000 mg | SUBCUTANEOUS | Status: DC
  Administered 2017-11-01 – 2017-11-06 (×4): 40 mg via SUBCUTANEOUS
  Filled 2017-11-01 (×6): qty 0.4

## 2017-11-01 MED ORDER — MIDAZOLAM HCL 5 MG/5ML IJ SOLN
INTRAMUSCULAR | Status: AC
  Filled 2017-11-01: qty 5

## 2017-11-01 MED ORDER — OXYCODONE HCL 5 MG PO TABS
5.0000 mg | ORAL_TABLET | ORAL | Status: DC | PRN
  Administered 2017-11-03: 5 mg via ORAL
  Filled 2017-11-01: qty 1

## 2017-11-01 NOTE — Progress Notes (Signed)
PHARMACY - PHYSICIAN COMMUNICATION CRITICAL VALUE ALERT - BLOOD CULTURE IDENTIFICATION (BCID)  Nathan Mcintosh is an 62 y.o. male who presented to [facility] on 10/30/2017 with a chief complaint of weakness  Assessment:  Tachycardic, 1/4 GPC BCID Staph species MecA +  Name of physician (or Provider) Contacted: Amelia Jo  Current antibiotics: None  Changes to prescribed antibiotics recommended:  Recommendations accepted by provider -- probably contamination  No results found for this or any previous visit.  Tobie Lords, PharmD, BCPS Clinical Pharmacist 11/01/2017

## 2017-11-01 NOTE — Progress Notes (Signed)
[place] at [place] NAME: Nathan Mcintosh    MR#:  [id number]  DATE OF BIRTH:  [birth year]  SUBJECTIVE:  CHIEF COMPLAINT: Patient is reporting ongoing abdominal pain for several weeks he has been progressively getting worse and significant weight loss. Patient had ultrasound-guided liver biopsy today and reporting 7-8 out of 10 abdominal pain  REVIEW OF SYSTEMS:  CONSTITUTIONAL: No fever, reporting fatigue and weight loss EYES: No blurred or double vision.  EARS, NOSE, AND THROAT: No tinnitus or ear pain.  RESPIRATORY: No cough, shortness of breath, wheezing or hemoptysis.  CARDIOVASCULAR: No chest pain, orthopnea, edema.  GASTROINTESTINAL: No nausea, vomiting, diarrhea.  Reporting upper abdominal pain.  GENITOURINARY: No dysuria, hematuria.  ENDOCRINE: No polyuria, nocturia,  HEMATOLOGY: No anemia, easy bruising or bleeding SKIN: No rash or lesion. MUSCULOSKELETAL: No joint pain or arthritis.   NEUROLOGIC: No tingling, numbness, weakness.  PSYCHIATRY: No anxiety or depression.   DRUG ALLERGIES:  No Known Allergies  VITALS:  Blood pressure 101/71, pulse (!) 108, temperature 99 F (37.2 C), temperature source Oral, resp. rate 16, height 5\' 2"  (1.575 m), weight 51 kg (112 lb 8 oz), SpO2 97 %.  PHYSICAL EXAMINATION:  GENERAL:  62 y.o.-year-old patient lying in the bed with no acute distress.  Cachectic EYES: Pupils equal, round, reactive to light and accommodation. No scleral icterus. Extraocular muscles intact.  HEENT: Head atraumatic, normocephalic. Oropharynx and nasopharynx clear.  NECK:  Supple, no jugular venous distention. No thyroid enlargement, no tenderness.  LUNGS: Normal breath sounds bilaterally, no wheezing, rales,rhonchi or crepitation. No use of accessory muscles of respiration.  CARDIOVASCULAR: S1, S2 normal. No murmurs, rubs, or gallops.  ABDOMEN: Soft, epigastric tenderness is present no rebound tenderness,  nondistended. Bowel sounds present.  Liver biopsy site looks intact EXTREMITIES: No pedal edema, cyanosis, or clubbing.  NEUROLOGIC: Cranial nerves II through XII are intact. Muscle strength global weakness in all extremities. Sensation intact. Gait not checked.  PSYCHIATRIC: The patient is alert and oriented x 3.  SKIN: No obvious rash, lesion, or ulcer.    LABORATORY PANEL:   CBC Recent Labs  Lab 10/30/17 2212  WBC 8.7  HGB 12.5*  HCT 39.3*  PLT 195   ------------------------------------------------------------------------------------------------------------------  Chemistries  Recent Labs  Lab 10/30/17 2212  NA 139  K 4.4  CL 104  CO2 22  GLUCOSE 118*  BUN 21*  CREATININE 0.72  CALCIUM 8.5*  AST 244*  ALT 110*  ALKPHOS 260*  BILITOT 0.8   ------------------------------------------------------------------------------------------------------------------  Cardiac Enzymes Recent Labs  Lab 10/30/17 2212  TROPONINI <0.03   ------------------------------------------------------------------------------------------------------------------  RADIOLOGY:  Ct Head Wo Contrast  Result Date: 10/31/2017 CLINICAL DATA:  Alcohol abuse and malnutrition. Severe weight loss, nausea and vomiting. EXAM: CT HEAD WITHOUT CONTRAST TECHNIQUE: Contiguous axial images were obtained from the base of the skull through the vertex without intravenous contrast. COMPARISON:  None. FINDINGS: BRAIN: There is sulcal and ventricular prominence consistent with superficial and central atrophy. No intraparenchymal hemorrhage, mass effect nor midline shift. Periventricular and subcortical white matter hypodensities consistent with chronic small vessel ischemic disease are identified. No acute large vascular territory infarcts. No abnormal extra-axial fluid collections. Basal cisterns are not effaced and midline. VASCULAR: Moderate calcific atherosclerosis of the carotid siphons. SKULL: No skull fracture. No  significant scalp soft tissue swelling. SINUSES/ORBITS: The mastoid air-cells are clear. The included paranasal sinuses are well-aerated.The included ocular globes and orbital contents are non-suspicious. OTHER: None. IMPRESSION: Atrophy with chronic  appearing small vessel ischemic disease. No acute intracranial abnormality. Electronically Signed   By: Ashley Royalty M.D.   On: 10/31/2017 00:42   Ct Abdomen Pelvis W Contrast  Result Date: 10/31/2017 CLINICAL DATA:  Alcohol abuse, malnutrition and abdominal pain. EXAM: CT ABDOMEN AND PELVIS WITH CONTRAST TECHNIQUE: Multidetector CT imaging of the abdomen and pelvis was performed using the standard protocol following bolus administration of intravenous contrast. CONTRAST:  131mL ISOVUE-300 IOPAMIDOL (ISOVUE-300) INJECTION 61% COMPARISON:  02/11/2014 CT FINDINGS: Lower chest: The included heart size is normal. No pericardial effusion. Lung bases are clear. No effusion. There is a small hiatal hernia Hepatobiliary: There are numerous ill-defined and heterogeneously enhancing masses of the liver consistent with metastasis. No biliary dilatation. No portal or splenic vein thrombosis. The gallbladder is free of stones and physiologically distended without focal mural thickening. Pancreas: Atretic pancreas without ductal dilatation. Spleen: No splenomegaly.  No definite mass. Adrenals/Urinary Tract: Mild fullness of the left adrenal apex. No discrete adrenal mass. Bilateral renal cysts without nephrolithiasis hydroureteronephrosis. 2.7 cm left upper pole and 0.9 cm right upper pole renal cysts. Nondistended urinary bladder with mild circumferential mural thickening likely due to underdistention, less likely cystitis. Stomach/Bowel: Bulky exophytic enhancing mass off the expected location of the distal body and antrum of the stomach with areas of central necrosis is identified, measuring roughly 10.7 x 9.9 x 7.9 cm. No gastric outlet obstruction is identified. Contrast  extends into the small intestine without obstruction. What is visualized of the colon is unremarkable. Separate pelvic peritoneal necrotic mass to the right of the rectum with eccentric mural thickening involving the left lateral wall of this lesion and predominately necrotic or cystic component on the right overall measures 5.5 x 5.8 x 5.5 cm. Vascular/Lymphatic: No aortic aneurysm or dissection. Difficult to appreciate lymphadenopathy due to patient CT axial due to the bulky nature of aforementioned mass. Reproductive: Normal size prostate. Other: No hernia. No free air. Small amount of free fluid in the pelvis and abdomen. Musculoskeletal: No aggressive osseous lesions. IMPRESSION: 1. Necrotic bulky exophytic appearing mass off the stomach measuring 10.7 x 9.9 x 7.9 cm with associated metastatic disease to the liver. Separate peritoneal metastatic mass deep within the pelvis adjacent to the rectum measuring 5.5 x 5.8 x 5.5 cm. 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 Other differential possibilities that may include lymphoma, gastric lymphoma, exophytic gastric carcinoma, or potentially soft tissue sarcoma invading stomach. Given the exophytic hypervascular, centrally necrotic appearance of this lesion off the stomach, favor a GIST tumor. 2. Simple bilateral renal cysts. Electronically Signed   By: Ashley Royalty M.D.   On: 10/31/2017 01:05   US Biopsy (liver)  Result Date: 11/01/2017 INDICATION: Liver metastatic disease EXAM: ULTRASOUND GUIDED LIVER BIOPSY MEDICATIONS: None. ANESTHESIA/SEDATION: None FLUOROSCOPY TIME:  Not applicable COMPLICATIONS: None immediate. PROCEDURE: Informed written consent was obtained from the patient after a thorough discussion of the procedural risks, benefits and alternatives. All questions were addressed. Maximal Sterile Barrier Technique was utilized including caps, mask, sterile gowns, sterile gloves, sterile drape, hand hygiene and  skin antiseptic. A timeout was performed prior to the initiation of the procedure. Utilizing 1% xylocaine as local anesthetic and real-time ultrasound guidance a 17 gauge guiding needle was placed percutaneously into the left lobe of the liver adjacent to 1 of the metastatic lesions. Multiple 18 gauge core biopsies were then obtained and sent for pathologic evaluation. The guiding needle was then removed and Gel-Foam slurry placed aid  in hemostasis. The patient tolerated procedure well was returned his room in satisfactory condition. IMPRESSION: Successful ultrasound guided liver biopsy as described. Electronically Signed   By: Inez Catalina M.D.   On: 11/01/2017 10:35   Dg Chest Portable 1 View  Result Date: 10/30/2017 CLINICAL DATA:  Cough EXAM: PORTABLE CHEST 1 VIEW COMPARISON:  05/06/2017 FINDINGS: Hyperinflation. No acute consolidation or effusion. Normal heart size. No pneumothorax. IMPRESSION: No active disease.  Hyperinflation. Electronically Signed   By: Donavan Foil M.D.   On: 10/30/2017 21:23    EKG:   Orders placed or performed during the hospital encounter of 10/30/17  . ED EKG  . ED EKG    ASSESSMENT AND PLAN:     A/P: 68M AMS/encephalopathy, stomach/liver mass.  1.) Acute encephalopathy: Could be from alcohol abuse Resolved   Ammonia 24. Lactate 4.4 - 1.7 , likely 2/2 liver disease  (-) F/WBC, SIRS (-), no obvious source of infxn.  TSH nml  RPR, B12/folate levels pending.  CT head (+) atrophy, (-) acute intracranial abnl.   Not in withdrawal.   Vitamin B12 1254,Folate normal  2.)  Acute abdominal pain/N/V w/ stomach/liver mass:  Seen by oncology Dr. Janese Banks appreciate recommendations  CT A/P (+) stomach mass w/ associated liver + peritoneal mets, GIST vs. Lymphoma vs.  LDH elevated at 2153 Elevated lactate, LFTs abnl (AST 244, ALT 110), INR 1.39, findings consistent w/ chronic liver disease.  Iron function studies ordered  Prognosis expected to be poor.  Oncology  consult placed, discussed with Dr. Janese Banks, recommending  EGD and biopsy of the mass, discussed with Dr. Vicente Males, he has discussed with radiologist Dr. Carman Ching, EGD is not a good option for the biopsy, canceled GI consult Patient had ultrasound-guided liver biopsy today by interventional radiology  call placed to patient's daughter [name] to call back to give an update  pt mightalso likely be a good candidate for Palliative Care.  Pain ctrl, antiemetics, supportive treatment  3.) HTN: BP ok. Hold Lopressor.  4.) FEN/GI: Cardiac diet as tolerated.  5.) DVT PPx: Lovenox 40mg  SQ qD.  Will be held tonight as patient is getting ultrasound-guided liver biopsy tomorrow after that we can resume Lovenox subcu  6.) Code status: Full code.  7.) Disposition: Will be decided tomorrow call placed to daughter.  PT consult placed increase out of bed as tolerated Anticipating to discharge patient once pain is well controlled need to follow-up with oncology as an outpatient,  All the records are reviewed and case discussed with Care Management/Social Workerr. Management plans discussed with the patient, granddaughter  And granddaughter will inform patient's daughter [name] to call us back for an update they are in agreement.  CODE STATUS: fc, daughter daughter is the healthcare POA  TOTAL TIME TAKING CARE OF THIS PATIENT: 36  minutes.   POSSIBLE D/C IN 2DAYS, DEPENDING ON CLINICAL CONDITION.  Note: This dictation was prepared with Dragon dictation along with smaller phrase technology. Any transcriptional errors that result from this process are unintentional.   Nicholes Mango M.D on 11/01/2017 at 3:42 PM  Between 7am to 6pm - Pager - [phone] After 6pm go to [email or web address] - password [email or web address] [facility]  Office  [phone]  CC: Primary care physician; Patient, No Pcp Per

## 2017-11-01 NOTE — Progress Notes (Signed)
Pharmacy - Post IR Procedure Consult  Patient previously receiving enoxaparin 40mg  SQ Q24H, discontinued prior to procedure. Procedure deemed to be low risk, will resume enoxaparin 40mg  Q24H this evening.  Rexene Edison, PharmD, BCPS Clinical Pharmacist  11/01/2017 10:30 AM

## 2017-11-01 NOTE — Procedures (Signed)
US liver biopsy without difficulty  Complications:  None  Blood Loss: none  See dictation in canopy pacs  

## 2017-11-02 LAB — HEPATITIS B SURFACE ANTIGEN: HEP B S AG: NEGATIVE

## 2017-11-02 LAB — URIC ACID: Uric Acid, Serum: 7 mg/dL (ref 4.4–7.6)

## 2017-11-02 LAB — HEPATITIS C ANTIBODY: HCV Ab: 0.1 s/co ratio (ref 0.0–0.9)

## 2017-11-02 LAB — HEPATITIS B CORE ANTIBODY, TOTAL: Hep B Core Total Ab: NEGATIVE

## 2017-11-02 LAB — HEPATITIS B SURFACE ANTIBODY,QUALITATIVE: HEP B S AB: NONREACTIVE

## 2017-11-02 MED ORDER — SODIUM CHLORIDE 0.9 % IV SOLN
100.0000 mg | Freq: Once | INTRAVENOUS | Status: AC
  Administered 2017-11-02: 10:00:00 100 mg via INTRAVENOUS
  Filled 2017-11-02: qty 5

## 2017-11-02 MED ORDER — NICOTINE 21 MG/24HR TD PT24
21.0000 mg | MEDICATED_PATCH | Freq: Every day | TRANSDERMAL | Status: DC
  Administered 2017-11-02 – 2017-11-07 (×6): 21 mg via TRANSDERMAL
  Filled 2017-11-02 (×6): qty 1

## 2017-11-02 MED ORDER — ALPRAZOLAM 0.25 MG PO TABS
0.2500 mg | ORAL_TABLET | Freq: Two times a day (BID) | ORAL | Status: DC | PRN
  Administered 2017-11-02: 0.25 mg via ORAL
  Filled 2017-11-02: qty 1

## 2017-11-02 NOTE — Progress Notes (Signed)
[place] at [place] NAME: Harlan Ervine    MR#:  [id number]  DATE OF BIRTH:  Jan 09, 1956  SUBJECTIVE:  CHIEF COMPLAINT: Patient has abdominal pain, weight loss, now abdominal pain little better, had ultrasound-guided liver biopsy yesterday.  States that he has abdominal pain pain 3 out of 10 in severity at this time.  No shortness of breath.  Quit drinking and smoking 2 weeks ago.  REVIEW OF SYSTEMS:  CONSTITUTIONAL: Weight loss, abdominal pain  eYES: No blurred or double vision.  EARS, NOSE, AND THROAT: No tinnitus or ear pain.  RESPIRATORY: No cough, shortness of breath, wheezing or hemoptysis.  CARDIOVASCULAR: No chest pain, orthopnea, edema.  GASTROINTESTINAL: No nausea, vomiting, diarrhea.  Reporting upper abdominal pain.  GENITOURINARY: No dysuria, hematuria.  ENDOCRINE: No polyuria, nocturia,  HEMATOLOGY: No anemia, easy bruising or bleeding SKIN: No rash or lesion. MUSCULOSKELETAL: No joint pain or arthritis.   NEUROLOGIC: No tingling, numbness, weakness.  PSYCHIATRY: No anxiety or depression.   DRUG ALLERGIES:  No Known Allergies  VITALS:  Blood pressure 117/82, pulse 99, temperature 98.6 F (37 C), temperature source Oral, resp. rate 18, height 5\' 2"  (1.575 m), weight 51 kg (112 lb 8 oz), SpO2 98 %.  PHYSICAL EXAMINATION:  GENERAL:  62 y.o.-year-old patient lying in the bed with no acute distress.  Cachectic Muscle wasting. EYES: Pupils equal, round, reactive to light and accommodation. No scleral icterus. Extraocular muscles intact.  HEENT: Head atraumatic, normocephalic. Oropharynx and nasopharynx clear.  NECK:  Supple, no jugular venous distention. No thyroid enlargement, no tenderness.  LUNGS: Normal breath sounds bilaterally, no wheezing, rales,rhonchi or crepitation. No use of accessory muscles of respiration.  CARDIOVASCULAR: S1, S2 normal. No murmurs, rubs, or gallops.  ABDOMEN: Soft, epigastric tenderness  is present no rebound tenderness . dressing present in  abdomen., nondistended. Bowel sounds present.  Liver biopsy site looks intact EXTREMITIES: No pedal edema, cyanosis, or clubbing.  NEUROLOGIC: Cranial nerves II through XII are intact. Muscle strength global weakness in all extremities. Sensation intact. Gait not checked.  PSYCHIATRIC: The patient is alert and oriented x 3.  SKIN: No obvious rash, lesion, or ulcer.    LABORATORY PANEL:   CBC Recent Labs  Lab 10/30/17 2212  WBC 8.7  HGB 12.5*  HCT 39.3*  PLT 195   ------------------------------------------------------------------------------------------------------------------  Chemistries  Recent Labs  Lab 10/30/17 2212  NA 139  K 4.4  CL 104  CO2 22  GLUCOSE 118*  BUN 21*  CREATININE 0.72  CALCIUM 8.5*  AST 244*  ALT 110*  ALKPHOS 260*  BILITOT 0.8   ------------------------------------------------------------------------------------------------------------------  Cardiac Enzymes Recent Labs  Lab 10/30/17 2212  TROPONINI <0.03   ------------------------------------------------------------------------------------------------------------------  RADIOLOGY:  US Biopsy (liver)  Result Date: 11/01/2017 INDICATION: Liver metastatic disease EXAM: ULTRASOUND GUIDED LIVER BIOPSY MEDICATIONS: None. ANESTHESIA/SEDATION: None FLUOROSCOPY TIME:  Not applicable COMPLICATIONS: None immediate. PROCEDURE: Informed written consent was obtained from the patient after a thorough discussion of the procedural risks, benefits and alternatives. All questions were addressed. Maximal Sterile Barrier Technique was utilized including caps, mask, sterile gowns, sterile gloves, sterile drape, hand hygiene and skin antiseptic. A timeout was performed prior to the initiation of the procedure. Utilizing 1% xylocaine as local anesthetic and real-time ultrasound guidance a 17 gauge guiding needle was placed percutaneously into the left lobe of  the liver adjacent to 1 of the metastatic lesions. Multiple 18 gauge core biopsies were then obtained and sent for pathologic evaluation.  The guiding needle was then removed and Gel-Foam slurry placed aid in hemostasis. The patient tolerated procedure well was returned his room in satisfactory condition. IMPRESSION: Successful ultrasound guided liver biopsy as described. Electronically Signed   By: Inez Catalina M.D.   On: 11/01/2017 10:35    EKG:   Orders placed or performed during the hospital encounter of 10/30/17  . ED EKG  . ED EKG    ASSESSMENT AND PLAN:     A/P: 36M AMS/encephalopathy, stomach/liver mass.  1.) Acute encephalopathy: Could be from alcohol abuse Resolved   Ammonia 24. Lactate 4.4 - 1.7 , likely 2/2 liver disease  (-) F/WBC, SIRS (-), no obvious source of infxn.  TSH nml Has severe iron deficiency anemia , iron level 8, TIBC 109  CT head (+) atrophy, (-) acute intracranial abnl.   Not in withdrawal.   Vitamin B12 1254,Folate normal  2.)  Acute abdominal pain/N/V w/ stomach/liver mass: Continue pain medicines, nausea medicines, Seen by oncology Dr. Janese Banks appreciate recommendations  CT A/P (+) stomach mass w/ associated liver + peritoneal mets, GIST vs. Lymphoma vs.  LDH elevated at 2153 Elevated lactate, LFTs abnl (AST 244, ALT 110), INR 1.39, findings consistent w/ chronic liver disease.    Prognosis expected to be poor.  Oncology consult placed, discussed with Dr. Janese Banks, Patient had ultrasound-guided liver biopsy  by interventional radiology yesterday.  Follow pathology results. Consult palliative care.  3.) HTN: BP soft, hold Lopressor at this time. 4.) FEN/GI: Because of possible severe malnutrition liberalize the diet. Consult nutritionist.  5.) DVT PPx: Lovenox 40mg  SQ qD.  Will be  6.) Code status: Full code.  #7. deconditioning, physical therapy consult today #8. tobacco abuse, alcohol abuse quit 2 weeks ago. . All the records are reviewed and  case discussed with Care Management/Social Workerr. Management plans discussed with the patient, granddaughter  And granddaughter will inform patient's daughter [name] to call us back for an update they are in agreement.  CODE STATUS: fc, daughter daughter is the healthcare POA  TOTAL TIME TAKING CARE OF THIS PATIENT: 36  minutes.   POSSIBLE D/C IN 2DAYS, DEPENDING ON CLINICAL CONDITION.  Note: This dictation was prepared with Dragon dictation along with smaller phrase technology. Any transcriptional errors that result from this process are unintentional.   Epifanio Lesches M.D on 11/02/2017 at 8:28 AM  Between 7am to 6pm - Pager - [phone] After 6pm go to [email or web address] - password [email or web address] [facility]  Office  [phone]  CC: Primary care physician; Patient, No Pcp Per

## 2017-11-02 NOTE — Evaluation (Signed)
Physical Therapy Evaluation Patient Details Name: Nathan Mcintosh MRN: [id number] DOB: Feb 01, 1956 Today's Date: 11/02/2017   History of Present Illness  pt is a 62 y.o M with dx of abdominal pain addmitted on 10/30/2017. male with a known history of HTN, EtOH abuse, tobacco use D/O, malnourishment/cachexia who p/w AMS. The pt is alert to person and place, but not to month or year. pt is a poor historian regarding living situation and prior level of function. he is only Aware of person and place.   Clinical Impression  Pt currently laying in bed when PT entered the room. He is A&O only to person and place and currently reports 8/10 pain in the R lower abdominal quandrant, but exhibits no visual indicators of pain level throughout session. He demonstrates general weakness in bil LE, and required MODA +1 for bed mobility and sit to stand transfer. He was able to walk ~5 ft with significant verbal cues regarding foot placement. Once in recliner he was able to perform exercise with verbal cuing required for participation. He would benefit from physical therapy to increase LE strength, promote bed mobility, transfers, ambulation with LRAD, and maximize function by addressing deficits listed. Based on evaluation findings and limited hx of prior level of function and living situation he would benefit from continued PT via SNF upon discharge.     Follow Up Recommendations SNF    Equipment Recommendations  Rolling walker with 5" wheels    Recommendations for Other Services OT consult     Precautions / Restrictions Precautions Precautions: Fall Restrictions Weight Bearing Restrictions: No      Mobility  Bed Mobility Overal bed mobility: Needs Assistance Bed Mobility: Supine to Sit     Supine to sit: Mod assist(+1 )     General bed mobility comments: frequent verbal cues regarding coordinating leg movement to EOB and placement of hands  Transfers Overall transfer level: Needs  assistance Equipment used: 1 person hand held assist Transfers: Sit to/from Stand Sit to Stand: Mod assist         General transfer comment: verbal cues required for rocking forward and standing   Ambulation/Gait Ambulation/Gait assistance: Mod assist Ambulation Distance (Feet): 5 Feet Assistive device: 1 person hand held assist Gait Pattern/deviations: Decreased stride length;Trunk flexed;Shuffle     General Gait Details: taking small steps to the recliner requiring constant verbal cues as to advance RLE or LLE  Stairs            Wheelchair Mobility    Modified Rankin (Stroke Patients Only)       Balance Overall balance assessment: Needs assistance Sitting-balance support: Feet supported   Sitting balance - Comments: pt required frequent verbal cues to sit up and avoid leaning backward. pt reported he has trouble sitting up Postural control: Posterior lean                                   Pertinent Vitals/Pain Pain Assessment: 0-10 Pain Score: 8  Pain Location: R abdominal lower quadrant Pain Descriptors / Indicators: Aching(no visual indicators of pain level throughout session)    Home Living Family/patient expects to be discharged to:: Private residence Living Arrangements: Non-relatives/Friends   Type of Home: Apartment Home Access: Stairs to enter   Technical brewer of Steps: 1 Home Layout: One level   Additional Comments: pt is a poor historian and is unsure of if he used any equipment for getting  around    Prior Function           Comments: unsure due to pt's limited ability to recall prior function     Hand Dominance   Dominant Hand: Right    Extremity/Trunk Assessment   Upper Extremity Assessment Upper Extremity Assessment: Defer to OT evaluation    Lower Extremity Assessment Lower Extremity Assessment: Generalized weakness(3+/5 in bil LE)       Communication   Communication: Other (comment)(talks  softly and mumbles)  Cognition Arousal/Alertness: Lethargic;Awake/alert(A& O to person and place only)                                            General Comments      Exercises Other Exercises Other Exercises: seated marching 1 x 10 with tactile cues for proper height Other Exercises: ankle pumps 1 x 16   Assessment/Plan    PT Assessment Patient needs continued PT services  PT Problem List Decreased strength;Decreased range of motion;Decreased activity tolerance;Decreased knowledge of use of DME;Decreased mobility;Decreased cognition;Pain       PT Treatment Interventions Gait training;Therapeutic activities;Therapeutic exercise;Balance training;Functional mobility training;Patient/family education    PT Goals (Current goals can be found in the Care Plan section)  Acute Rehab PT Goals Patient Stated Goal: to get out and go home PT Goal Formulation: With patient Time For Goal Achievement: 11/16/17 Potential to Achieve Goals: Good    Frequency Min 2X/week   Barriers to discharge Other (comment) unsure of current living situation based on pt's cognition and poor recall    Co-evaluation               AM-PAC PT "6 Clicks" Daily Activity  Outcome Measure Difficulty turning over in bed (including adjusting bedclothes, sheets and blankets)?: Unable Difficulty moving from lying on back to sitting on the side of the bed? : Unable Difficulty sitting down on and standing up from a chair with arms (e.g., wheelchair, bedside commode, etc,.)?: Unable Help needed moving to and from a bed to chair (including a wheelchair)?: A Lot Help needed walking in hospital room?: A Lot Help needed climbing 3-5 steps with a railing? : Total 6 Click Score: 8    End of Session Equipment Utilized During Treatment: Gait belt Activity Tolerance: Patient tolerated treatment well;Patient limited by fatigue;Patient limited by lethargy Patient left: in chair;with call bell/phone  within reach;with chair alarm set Nurse Communication: Mobility status(how session went) PT Visit Diagnosis: Unsteadiness on feet (R26.81);History of falling (Z91.81);Adult, failure to thrive (R62.7);Other abnormalities of gait and mobility (R26.89);Muscle weakness (generalized) (M62.81)    Time: 9373-4287 PT Time Calculation (min) (ACUTE ONLY): 24 min   Charges:   PT Evaluation $PT Eval Moderate Complexity: 1 Mod PT Treatments $Therapeutic Exercise: 8-22 mins   PT G Codes:        Nathan Mcintosh PT, DPT, LAT, ATC  11/02/17  12:37 PM       Nathan Mcintosh 11/02/2017, 12:33 PM

## 2017-11-02 NOTE — Clinical Social Work Note (Signed)
CSW is aware through chart review that PT is recommending SNF. CSW has attempted to contact the patient's HCPOA (his daughter) at both numbers with no success and no ability to leave a voice message. CSW will continue to attempt contact as able. CSW is following.  Santiago Bumpers, MSW, Latanya Presser [phone]

## 2017-11-03 ENCOUNTER — Inpatient Hospital Stay: Payer: Medicaid Other

## 2017-11-03 LAB — CBC WITH DIFFERENTIAL/PLATELET
BASOS ABS: 0 10*3/uL (ref 0–0.1)
BASOS PCT: 0 %
EOS ABS: 0 10*3/uL (ref 0–0.7)
Eosinophils Relative: 0 %
HCT: 24.1 % — ABNORMAL LOW (ref 40.0–52.0)
Hemoglobin: 8 g/dL — ABNORMAL LOW (ref 13.0–18.0)
LYMPHS PCT: 8 %
Lymphs Abs: 0.9 10*3/uL — ABNORMAL LOW (ref 1.0–3.6)
MCH: 25.9 pg — ABNORMAL LOW (ref 26.0–34.0)
MCHC: 33.2 g/dL (ref 32.0–36.0)
MCV: 78.1 fL — ABNORMAL LOW (ref 80.0–100.0)
Monocytes Absolute: 0.9 10*3/uL (ref 0.2–1.0)
Monocytes Relative: 9 %
Neutro Abs: 9 10*3/uL — ABNORMAL HIGH (ref 1.4–6.5)
Neutrophils Relative %: 83 %
Platelets: 146 10*3/uL — ABNORMAL LOW (ref 150–440)
RBC: 3.09 MIL/uL — AB (ref 4.40–5.90)
RDW: 22.7 % — ABNORMAL HIGH (ref 11.5–14.5)
WBC: 10.8 10*3/uL — AB (ref 3.8–10.6)

## 2017-11-03 LAB — COMPREHENSIVE METABOLIC PANEL
ALBUMIN: 1.8 g/dL — AB (ref 3.5–5.0)
ALT: 95 U/L — AB (ref 17–63)
AST: 127 U/L — AB (ref 15–41)
Alkaline Phosphatase: 353 U/L — ABNORMAL HIGH (ref 38–126)
Anion gap: 8 (ref 5–15)
BUN: 7 mg/dL (ref 6–20)
CHLORIDE: 105 mmol/L (ref 101–111)
CO2: 24 mmol/L (ref 22–32)
CREATININE: 0.44 mg/dL — AB (ref 0.61–1.24)
Calcium: 7.3 mg/dL — ABNORMAL LOW (ref 8.9–10.3)
GFR calc Af Amer: >60 mL/min (ref >60)
GFR calc non Af Amer: >60 mL/min (ref >60)
Glucose, Bld: 121 mg/dL — ABNORMAL HIGH (ref 65–99)
Potassium: 3.1 mmol/L — ABNORMAL LOW (ref 3.5–5.1)
SODIUM: 137 mmol/L (ref 135–145)
Total Bilirubin: 0.8 mg/dL (ref 0.3–1.2)
Total Protein: 5.6 g/dL — ABNORMAL LOW (ref 6.5–8.1)

## 2017-11-03 LAB — CBC
HCT: 27.1 % — ABNORMAL LOW (ref 40.0–52.0)
Hemoglobin: 8.8 g/dL — ABNORMAL LOW (ref 13.0–18.0)
MCH: 25.5 pg — AB (ref 26.0–34.0)
MCHC: 32.3 g/dL (ref 32.0–36.0)
MCV: 78.8 fL — ABNORMAL LOW (ref 80.0–100.0)
PLATELETS: 160 10*3/uL (ref 150–440)
RBC: 3.43 MIL/uL — AB (ref 4.40–5.90)
RDW: 22.8 % — ABNORMAL HIGH (ref 11.5–14.5)
WBC: 11.7 10*3/uL — ABNORMAL HIGH (ref 3.8–10.6)

## 2017-11-03 LAB — LACTIC ACID, PLASMA
LACTIC ACID, VENOUS: 3.4 mmol/L — AB (ref 0.5–1.9)
Lactic Acid, Venous: 2.8 mmol/L (ref 0.5–1.9)

## 2017-11-03 LAB — MAGNESIUM: Magnesium: 1.6 mg/dL — ABNORMAL LOW (ref 1.7–2.4)

## 2017-11-03 LAB — MRSA PCR SCREENING: MRSA by PCR: NEGATIVE

## 2017-11-03 LAB — CULTURE, BLOOD (ROUTINE X 2)

## 2017-11-03 LAB — PROCALCITONIN: Procalcitonin: 1.28 ng/mL

## 2017-11-03 LAB — PROTIME-INR
INR: 1.4
Prothrombin Time: 17 seconds — ABNORMAL HIGH (ref 11.4–15.2)

## 2017-11-03 LAB — PHOSPHORUS: Phosphorus: 1.1 mg/dL — ABNORMAL LOW (ref 2.5–4.6)

## 2017-11-03 LAB — APTT: APTT: 29 s (ref 24–36)

## 2017-11-03 MED ORDER — SODIUM CHLORIDE 0.9 % IV BOLUS (SEPSIS)
250.0000 mL | Freq: Once | INTRAVENOUS | Status: AC
  Administered 2017-11-03: 250 mL via INTRAVENOUS

## 2017-11-03 MED ORDER — VANCOMYCIN HCL IN DEXTROSE 1-5 GM/200ML-% IV SOLN
1000.0000 mg | Freq: Once | INTRAVENOUS | Status: AC
  Administered 2017-11-03: 1000 mg via INTRAVENOUS
  Filled 2017-11-03: qty 200

## 2017-11-03 MED ORDER — ENSURE ENLIVE PO LIQD
237.0000 mL | Freq: Three times a day (TID) | ORAL | Status: DC
  Administered 2017-11-03 – 2017-11-06 (×10): 237 mL via ORAL

## 2017-11-03 MED ORDER — POTASSIUM CHLORIDE 20 MEQ PO PACK
20.0000 meq | PACK | Freq: Two times a day (BID) | ORAL | Status: DC
  Administered 2017-11-03: 16:00:00 20 meq via ORAL
  Filled 2017-11-03: qty 1

## 2017-11-03 MED ORDER — PIPERACILLIN-TAZOBACTAM 3.375 G IVPB
3.3750 g | Freq: Three times a day (TID) | INTRAVENOUS | Status: DC
  Administered 2017-11-03 – 2017-11-07 (×12): 3.375 g via INTRAVENOUS
  Filled 2017-11-03 (×12): qty 50

## 2017-11-03 MED ORDER — POTASSIUM CHLORIDE IN NACL 20-0.9 MEQ/L-% IV SOLN
INTRAVENOUS | Status: DC
  Administered 2017-11-03 – 2017-11-04 (×2): via INTRAVENOUS
  Filled 2017-11-03 (×3): qty 1000

## 2017-11-03 MED ORDER — FERROUS SULFATE 325 (65 FE) MG PO TABS
325.0000 mg | ORAL_TABLET | Freq: Every day | ORAL | Status: DC
  Administered 2017-11-03 – 2017-11-07 (×5): 325 mg via ORAL
  Filled 2017-11-03 (×5): qty 1

## 2017-11-03 MED ORDER — VANCOMYCIN HCL IN DEXTROSE 750-5 MG/150ML-% IV SOLN
750.0000 mg | Freq: Two times a day (BID) | INTRAVENOUS | Status: DC
  Administered 2017-11-03 – 2017-11-04 (×2): 750 mg via INTRAVENOUS
  Filled 2017-11-03 (×3): qty 150

## 2017-11-03 MED ORDER — DEXTROSE 5 % IV SOLN
45.0000 mmol | Freq: Once | INTRAVENOUS | Status: AC
  Administered 2017-11-03: 45 mmol via INTRAVENOUS
  Filled 2017-11-03: qty 15

## 2017-11-03 MED ORDER — MAGNESIUM SULFATE 2 GM/50ML IV SOLN
2.0000 g | Freq: Once | INTRAVENOUS | Status: AC
  Administered 2017-11-03: 2 g via INTRAVENOUS
  Filled 2017-11-03: qty 50

## 2017-11-03 MED ORDER — SODIUM CHLORIDE 0.9 % IV BOLUS (SEPSIS)
1000.0000 mL | Freq: Once | INTRAVENOUS | Status: AC
  Administered 2017-11-03: 12:00:00 1000 mL via INTRAVENOUS

## 2017-11-03 MED ORDER — SODIUM CHLORIDE 0.9 % IV BOLUS (SEPSIS)
500.0000 mL | Freq: Once | INTRAVENOUS | Status: AC
  Administered 2017-11-03: 13:00:00 500 mL via INTRAVENOUS

## 2017-11-03 MED ORDER — SODIUM CHLORIDE 0.9 % IV SOLN: INTRAVENOUS | Status: DC

## 2017-11-03 NOTE — Progress Notes (Signed)
Pt's lactic acid came back at 3.4 and WBC are elevated as well. Dr. Vianne Bulls notified and code sepsis called to [facility].

## 2017-11-03 NOTE — Progress Notes (Addendum)
Pt's lactic acid now at 2.8 K is 3.1. Dr. Vianne Bulls notified via text page system.  Update: Per Dr. Vianne Bulls will change NS continuous infusion to NS with 72meq of K at 35ml/hr.

## 2017-11-03 NOTE — Progress Notes (Signed)
Family Meeting Note  Advance Directive:yes  Today a meeting took place with the Patient.  Nathan Mcintosh is able to participate in decision-making and he wanted to be full code at this time  The following clinical team members were present during this meeting:MD  The following were discussed:Patient's diagnosis: , Discussed with him about his abdominal mass and liver mets, peritoneal mets, prognosis really poor but at this time he wants to be full code.  Will request palliative care consult tomorrow and also discussed with oncology regarding overall life expectancy and then recommend DNR status.  Additional follow-up to be provided: Follow the patient  Time spent during discussion:20 minutes  Epifanio Lesches, MD

## 2017-11-03 NOTE — Progress Notes (Signed)
[facility] responded to rapid code to patient's room. [facility] arrived and patient was on phone. [facility] spoke with medical staff and it was a code for sepsis. [facility] is available for follow-up upon request.

## 2017-11-03 NOTE — Progress Notes (Addendum)
MEDICATION RELATED CONSULT NOTE - INITIAL   Pharmacy Consult for electrolyte management Indication: hypokalemia  No Known Allergies  Patient Measurements: Height: 5\' 2"  (157.5 cm) Weight: 112 lb 8 oz (51 kg) IBW/kg (Calculated) : 54.6 Adjusted Body Weight:   Vital Signs: Temp: 98.1 F (36.7 C) (04/28 1350) Temp Source: Oral (04/28 1350) BP: 132/92 (04/28 1350) Pulse Rate: 110 (04/28 1350) Intake/Output from previous day: 04/27 0701 - 04/28 0700 In: 903.3 [P.O.:340; I.V.:463.3; IV Piggyback:100] Out: 25 [Urine:25] Intake/Output from this shift: Total I/O In: 2000 [IV Piggyback:2000] Out: -   Labs: Recent Labs    11/03/17 1048 11/03/17 1225  WBC 11.7* 10.8*  HGB 8.8* 8.0*  HCT 27.1* 24.1*  PLT 160 146*  APTT  --  29  CREATININE  --  0.44*  ALBUMIN  --  1.8*  PROT  --  5.6*  AST  --  127*  ALT  --  95*  ALKPHOS  --  353*  BILITOT  --  0.8   Estimated Creatinine Clearance: 70.8 mL/min (A) (by C-G formula based on SCr of 0.44 mg/dL (L)).   Microbiology: Recent Results (from the past 720 hour(s))  Blood Culture (routine x 2)     Status: None (Preliminary result)   Collection Time: 10/30/17 12:49 AM  Result Value Ref Range Status   Specimen Description BLOOD BLOOD LEFT FOREARM  Final   Special Requests   Final    BOTTLES DRAWN AEROBIC AND ANAEROBIC Blood Culture results may not be optimal due to an inadequate volume of blood received in culture bottles   Culture   Final    NO GROWTH 3 DAYS Performed at [facility], [address], [place], [place] [address]    Report Status PENDING  Incomplete  Blood Culture (routine x 2)     Status: Abnormal   Collection Time: 10/30/17 12:49 AM  Result Value Ref Range Status   Specimen Description   Final    BLOOD RIGHT ANTECUBITAL Performed at [facility], [address], [place], [place] [address]    Special Requests   Final    BOTTLES DRAWN AEROBIC AND ANAEROBIC Blood Culture results may not be  optimal due to an inadequate volume of blood received in culture bottles Performed at [facility], [address] [place], [place] [address]    Culture  Setup Time   Final    GRAM POSITIVE COCCI AEROBIC BOTTLE ONLY CRITICAL RESULT CALLED TO, READ BACK BY AND VERIFIED WITH: DAVID BESANTI @ 6010 ON 11/01/2017 BY CAF    Culture (A)  Final    STAPHYLOCOCCUS SPECIES (COAGULASE NEGATIVE) THE SIGNIFICANCE OF ISOLATING THIS ORGANISM FROM A SINGLE SET OF BLOOD CULTURES WHEN MULTIPLE SETS ARE DRAWN IS UNCERTAIN. PLEASE NOTIFY THE MICROBIOLOGY DEPARTMENT WITHIN ONE WEEK IF SPECIATION AND SENSITIVITIES ARE REQUIRED. Performed at [facility], [place] [address], [place], [place] [address]    Report Status 11/03/2017 FINAL  Final  Blood Culture ID Panel (Reflexed)     Status: Abnormal   Collection Time: 10/30/17 12:49 AM  Result Value Ref Range Status   Enterococcus species NOT DETECTED NOT DETECTED Final   Listeria monocytogenes NOT DETECTED NOT DETECTED Final   Staphylococcus species DETECTED (A) NOT DETECTED Final    Comment: Methicillin (oxacillin) resistant coagulase negative staphylococcus. Possible blood culture contaminant (unless isolated from more than one blood culture draw or clinical case suggests pathogenicity). No antibiotic treatment is indicated for blood  culture contaminants. CRITICAL RESULT CALLED  TO, READ BACK BY AND VERIFIED WITH: DAVID BESANTI @ 9379 ON 11/01/2017 BY CAF    Staphylococcus aureus NOT DETECTED NOT DETECTED Final   Methicillin resistance DETECTED (A) NOT DETECTED Final    Comment: CRITICAL RESULT CALLED TO, READ BACK BY AND VERIFIED WITH: DAVID BESANTI @ 0133 ON 11/01/2017 BY CAF    Streptococcus species NOT DETECTED NOT DETECTED Final   Streptococcus agalactiae NOT DETECTED NOT DETECTED Final   Streptococcus pneumoniae NOT DETECTED NOT DETECTED Final   Streptococcus pyogenes NOT DETECTED NOT DETECTED Final   Acinetobacter baumannii NOT DETECTED  NOT DETECTED Final   Enterobacteriaceae species NOT DETECTED NOT DETECTED Final   Enterobacter cloacae complex NOT DETECTED NOT DETECTED Final   Escherichia coli NOT DETECTED NOT DETECTED Final   Klebsiella oxytoca NOT DETECTED NOT DETECTED Final   Klebsiella pneumoniae NOT DETECTED NOT DETECTED Final   Proteus species NOT DETECTED NOT DETECTED Final   Serratia marcescens NOT DETECTED NOT DETECTED Final   Haemophilus influenzae NOT DETECTED NOT DETECTED Final   Neisseria meningitidis NOT DETECTED NOT DETECTED Final   Pseudomonas aeruginosa NOT DETECTED NOT DETECTED Final   Candida albicans NOT DETECTED NOT DETECTED Final   Candida glabrata NOT DETECTED NOT DETECTED Final   Candida krusei NOT DETECTED NOT DETECTED Final   Candida parapsilosis NOT DETECTED NOT DETECTED Final   Candida tropicalis NOT DETECTED NOT DETECTED Final    Comment: Performed at [facility], [address], [place], [place] [address]    Medical History: History reviewed. No pertinent past medical history.  Medications:  Infusions:  . 0.9 % NaCl with KCl 20 mEq / L 75 mL/hr at 11/03/17 1442  . piperacillin-tazobactam (ZOSYN)  IV 3.375 g (11/03/17 1230)  . vancomycin      Assessment: 62 yom cc abdominal pain with acute encephalopathy. Lactate elevated d/t liver disease/stomach mass (no obvious source of infection), although patient is in Zosyn now for sepsis of unknown source. K 3.1, Ca 7.3 (adjusted Ca 9.06).   Goal of Therapy:  K 3.5 to 5 Ca 8.9 to 10.3 Mg 1.7 to 2.4 Phos 2.5 to 4.6  Plan:  Provider ordered NS with 20 mEq/L at 75 mL/hr which will supply approximately 36 mEq/24 hours. Will give additional potassium chloride packet 20 mEq po BID x 2 doses, check add-on Mg and phos. Recheck all electrolytes tomorrow with AM labs.  4/28 16:12 add-on Mg 1.6, phos 1.1. Stop oral potassium replacement (patient received 1 dose of 20 mEq). Will give potassium phosphate 45 mmol IV x 1 and magnesium  sulfate 2 gm IV x 1. Recheck electrolytes tomorrow with AM labs.  Laural Benes, Pharm.D., BCPS Clinical Pharmacist 11/03/2017,3:34 PM

## 2017-11-03 NOTE — Progress Notes (Signed)
Pt's current VS: BP 109/71 (BP Location: Left Arm)   Pulse (!) 108   Temp 100 F (37.8 C) (Oral)   Resp (!) 30   Ht 5\' 2"  (1.575 m)   Wt 112 lb 8 oz (51 kg)   SpO2 100%   BMI 20.58 kg/m . Pt also endorses slight SOB as well as pain in abdomen. Pain medication administered and Dr. Vianne Bulls notified. She stated to order lactic acid, chest xray, and CBC.

## 2017-11-03 NOTE — Progress Notes (Signed)
CODE SEPSIS - PHARMACY COMMUNICATION  **Broad Spectrum Antibiotics should be administered within 1 hour of Sepsis diagnosis**  Time Code Sepsis Called/Page Received: 11:55  Antibiotics Ordered: Vanc/Zosyn  Time of 1st antibiotic administration: 12:30  Additional action taken by pharmacy:   If necessary, Name of Provider/Nurse Contacted:     Laural Benes ,PharmD Clinical Pharmacist  11/03/2017  11:56 AM

## 2017-11-03 NOTE — Progress Notes (Signed)
[facility] NAME: Nathan Mcintosh    MR#:  [id number]  DATE OF BIRTH:  1956/04/12  SUBJECTIVE:  CHIEF COMPLAINT: Abdominal pain 7 out of 10 severity and some nausea.  Appetite poor.  Has mild cough.  REVIEW OF SYSTEMS:  CONSTITUTIONAL: Weight loss, abdominal pain  eYES: No blurred or double vision.  EARS, NOSE, AND THROAT: No tinnitus or ear pain.  RESPIRATORY: mild cough, shortness of breath, wheezing or hemoptysis.  CARDIOVASCULAR: No chest pain, orthopnea, edema.  GASTROINTESTINAL: No nausea, vomiting, diarrhea.  Reporting upper abdominal pain.  GENITOURINARY: No dysuria, hematuria.  ENDOCRINE: No polyuria, nocturia,  HEMATOLOGY: No anemia, easy bruising or bleeding SKIN: No rash or lesion. MUSCULOSKELETAL: No joint pain or arthritis.   NEUROLOGIC: No tingling, numbness, weakness.  PSYCHIATRY: No anxiety or depression.   DRUG ALLERGIES:  No Known Allergies  VITALS:  Blood pressure 121/80, pulse (!) 101, temperature 98 F (36.7 C), temperature source Oral, resp. rate 18, height 5\' 2"  (1.575 m), weight 51 kg (112 lb 8 oz), SpO2 100 %.  PHYSICAL EXAMINATION:  GENERAL:  62 y.o.-year-old patient lying in the bed with no acute distress.  Cachectic severe muscle wasting  EYES: Pupils equal, round, reactive to light and accommodation. No scleral icterus. Extraocular muscles intact.  HEENT: Head atraumatic, normocephalic. Oropharynx and nasopharynx clear.  NECK:  Supple, no jugular venous distention. No thyroid enlargement, no tenderness.  LUNGS: Normal breath sounds bilaterally, no wheezing, rales,rhonchi or crepitation. No use of accessory muscles of respiration.  CARDIOVASCULAR: S1, S2 normal. No murmurs, rubs, or gallops.  ABDOMEN: Soft, epigastric tenderness is present no rebound tenderness . dressing present in  abdomen., nondistended. Bowel sounds present.  Liver biopsy site looks intact EXTREMITIES: No pedal edema,  cyanosis, or clubbing.  NEUROLOGIC: Cranial nerves II through XII are intact. Muscle strength global weakness in all extremities. Sensation intact. Gait not checked.  PSYCHIATRIC: The patient is alert and oriented x 3.  SKIN: No obvious rash, lesion, or ulcer.    LABORATORY PANEL:   CBC Recent Labs  Lab 10/30/17 2212  WBC 8.7  HGB 12.5*  HCT 39.3*  PLT 195   ------------------------------------------------------------------------------------------------------------------  Chemistries  Recent Labs  Lab 10/30/17 2212  NA 139  K 4.4  CL 104  CO2 22  GLUCOSE 118*  BUN 21*  CREATININE 0.72  CALCIUM 8.5*  AST 244*  ALT 110*  ALKPHOS 260*  BILITOT 0.8   ------------------------------------------------------------------------------------------------------------------  Cardiac Enzymes Recent Labs  Lab 10/30/17 2212  TROPONINI <0.03   ------------------------------------------------------------------------------------------------------------------  RADIOLOGY:  US Biopsy (liver)  Result Date: 11/01/2017 INDICATION: Liver metastatic disease EXAM: ULTRASOUND GUIDED LIVER BIOPSY MEDICATIONS: None. ANESTHESIA/SEDATION: None FLUOROSCOPY TIME:  Not applicable COMPLICATIONS: None immediate. PROCEDURE: Informed written consent was obtained from the patient after a thorough discussion of the procedural risks, benefits and alternatives. All questions were addressed. Maximal Sterile Barrier Technique was utilized including caps, mask, sterile gowns, sterile gloves, sterile drape, hand hygiene and skin antiseptic. A timeout was performed prior to the initiation of the procedure. Utilizing 1% xylocaine as local anesthetic and real-time ultrasound guidance a 17 gauge guiding needle was placed percutaneously into the left lobe of the liver adjacent to 1 of the metastatic lesions. Multiple 18 gauge core biopsies were then obtained and sent for pathologic evaluation. The guiding needle was then  removed and Gel-Foam slurry placed aid in hemostasis. The patient tolerated procedure well was returned his room in satisfactory condition. IMPRESSION:  Successful ultrasound guided liver biopsy as described. Electronically Signed   By: Inez Catalina M.D.   On: 11/01/2017 10:35    EKG:   Orders placed or performed during the hospital encounter of 10/30/17  . ED EKG  . ED EKG    ASSESSMENT AND PLAN:     A/P: 62M AMS/encephalopathy, stomach/liver mass.  1.) Acute encephalopathy: Could be from alcohol abuse Resolved   Ammonia 24. Lactate 4.4 - 1.7 , likely 2/2 liver disease  (-) F/WBC, SIRS (-), no obvious source of infxn.  TSH nml Has severe iron deficiency anemia , iron level 8, TIBC 109  CT head (+) atrophy, (-) acute intracranial abnl.   Not in withdrawal.   Vitamin B12 1254,Folate normal  2.)  Acute abdominal pain/N/V w/ stomach/liver mass: Continue pain medicines, nausea medicines, Seen by oncology Dr. Janese Banks appreciate recommendations  CT A/P (+) stomach mass w/ associated liver + peritoneal mets, GIST vs. Lymphoma vs.  LDH elevated at 2153 Elevated lactate, LFTs abnl (AST 244, ALT 110), INR 1.39, findings consistent w/ chronic liver disease.    Prognosis expected to be poor.  Oncology consult placed, discussed with Dr. Janese Banks, Patient had ultrasound-guided liver biopsy  by interventional radiology . Follow pathology results. Consult palliative care.  Tomorrow.  Prognosis really poor.  3.) HTN: BP soft, hold Lopressor at this time. 4.) FEN/GI: Because of possible severe malnutrition liberalize the diet. Consult nutritionist.  5.) DVT PPx: Lovenox 40mg  SQ qD.  Will be  6.) Code status: Full code.  #7. deconditioning, Physical  therapy recommended SNF placement #8. tobacco abuse, alcohol abuse quit 2 weeks ago. . All the records are reviewed and case discussed with Care Management/Social Workerr. Management plans discussed with the patient, granddaughter  And granddaughter  will inform patient's daughter [name] to call us back for an update they are in agreement.  CODE STATUS: fc, daughter daughter is the healthcare POA  TOTAL TIME TAKING CARE OF THIS PATIENT: 36  minutes.   POSSIBLE D/C IN 2DAYS, DEPENDING ON CLINICAL CONDITION.  Note: This dictation was prepared with Dragon dictation along with smaller phrase technology. Any transcriptional errors that result from this process are unintentional.   Epifanio Lesches M.D on 11/03/2017 at 8:45 AM  Between 7am to 6pm - Pager - [phone] After 6pm go to [email or web address] - password [email or web address] [facility]  Office  [phone]  CC: Primary care physician; Patient, No Pcp Per

## 2017-11-03 NOTE — Progress Notes (Signed)
Pt went into SVT for 7seconds per central tele. Pt now is ST with HR in the low 100s without PVC/PAC per central tele. Dr. Vianne Bulls notified and order to add-on magnesium and request pharmacy consult to manage electrolytes received and placed in epic.

## 2017-11-03 NOTE — Progress Notes (Signed)
Pharmacy Antibiotic Note  Nathan Mcintosh is a 62 y.o. male admitted on 10/30/2017 with pneumonia and IAI.  Pharmacy has been consulted for Zosyn and vancomycin dosing.  Plan: 1. Zosyn 3.375 gm IV Q8H EI 2. Vancomycin 1 gm IV x 1 followed in approximately 9 hours (stacked dosing) by vancomycin 750 mg IV Q12H, predicted trough 16 mcg/mL. Pharmacy will continue to follow and adjust as needed to maintain trough 15 to 20 mcg/mL.   Vd 38.2 L, Ke 0.067 hr-1, T1/2 10.3 hr  Height: 5\' 2"  (157.5 cm) Weight: 112 lb 8 oz (51 kg) IBW/kg (Calculated) : 54.6  Temp (24hrs), Avg:98.5 F (36.9 C), Min:97.9 F (36.6 C), Max:100 F (37.8 C)  Recent Labs  Lab 10/30/17 2212 10/31/17 0644 11/03/17 1048 11/03/17 1225  WBC 8.7  --  11.7* 10.8*  CREATININE 0.72  --   --   --   LATICACIDVEN 4.4* 1.7 3.4*  --     Estimated Creatinine Clearance: 70.8 mL/min (by C-G formula based on SCr of 0.72 mg/dL).    No Known Allergies  Antimicrobials this admission: Ceftriaxone 4/24 to 4/25 (1 dose given)  Dose adjustments this admission:   Microbiology results: 4/24 BCx: CoNS 1 of 4 bottles 4/28 MRSA PCR: pending  Thank you for allowing pharmacy to be a part of this patient's care.  Laural Benes, Pharm.D., BCPS Clinical Pharmacist 11/03/2017 12:53 PM

## 2017-11-03 NOTE — Clinical Social Work Note (Signed)
Clinical Social Work Assessment  Patient Details  Name: Nathan Mcintosh MRN: [id number] Date of Birth: 05/22/1956  Date of referral:  11/03/17               Reason for consult:  Substance Use/ETOH Abuse, Facility Placement                Permission sought to share information with:  Chartered certified accountant granted to share information::  Yes, Verbal Permission Granted  Name::        Agency::  [facility] area SNFs  Relationship::     Contact Information:     Housing/Transportation Living arrangements for the past 2 months:  [place] of Information:  Patient, Medical Team Patient Interpreter Needed:  None Criminal Activity/Legal Involvement Pertinent to Current Situation/Hospitalization:  No - Comment as needed Significant Relationships:  Adult Children, Significant Other Lives with:  Significant Other Do you feel safe going back to the place where you live?  Yes Need for family participation in patient care:  No (Coment)  Care giving concerns:  PT recommendation for SNF; new cancer diagnosis, hx of ETOH abuse; malnutrition in the setting of cancer and ETOH abuse   Social Worker assessment / plan:  The CSW met with the patient at bedside to discuss discharge planning. The patient was lethargic and flat in affect. The client was able to understand the recommendation and asked appropriate questions about SNF such as how long he would need to stay and when he would go. The CSW advised the patient that Medicaid requires a 30 day stay and that discharge timing is pending continued medical workup. The patient gave verbal consent to begin the referral process.  At baseline, the patient lives with his significant other and has support from his adult daughter. The CSW attempted to contact the patient's daughter with no success and no ability to leave a voicemail. The CSW has begun the referral process and will continue to follow to contact the patient's  family and to assist with discharge facilitation. The patient's PASRR is pending as he will require a level II due to mental illness and ETOH use. The CSW has sent all needed documentation to [place] [facility] to assist in the process.  Employment status:  Retired Forensic scientist:  Medicaid In [place] PT Recommendations:  [place] / Referral to community resources:  [place]  Patient/Family's Response to care:  The patient thanked the CSW.  Patient/Family's Understanding of and Emotional Response to Diagnosis, Current Treatment, and Prognosis: The patient may need a psychiatric consult to determine if he is having difficulty processing his new diagnosis due to his history of recurrent MDD with psychotic features. The patient's affect is flat, and while he seems to understand the recommendations, it is highly likely that he has limited insight into the severity of his medical problems.  Emotional Assessment Appearance:  Appears older than stated age Attitude/Demeanor/Rapport:  Lethargic Affect (typically observed):  Flat Orientation:  Oriented to Self, Oriented to Place Alcohol / Substance use:  Alcohol Use, Tobacco Use Psych involvement (Current and /or in the community):  Yes (Comment)(The patient has a history of inpatient psychi in 2016. )  Discharge Needs  Concerns to be addressed:  Adjustment to Illness, Discharge Planning Concerns, Substance Abuse Concerns, Home Safety Concerns, Compliance Issues Concerns, Mental Health Concerns Readmission within the last 30 days:  No Current discharge risk:  Chronically ill Barriers to Discharge:  Continued Medical Work up  Zettie Pho, LCSW 11/03/2017, 10:40 AM

## 2017-11-03 NOTE — NC FL2 (Signed)
[place] LEVEL OF CARE SCREENING TOOL     IDENTIFICATION  Patient Name: Nathan Mcintosh Birthdate: [birth year] Sex: male Admission Date (Current Location): 10/30/2017  [place] and [place] Number:  Selena Lesser [id number] [place] and Address:  [facility], [address] [place], [place] [address]      Provider Number: [id number]  Attending Physician Name and Address:  Epifanio Lesches, MD  Relative Name and Phone Number:  Omarion Minnehan (daughter) [phone]    Current Level of Care: Hospital Recommended Level of Care: [place] Prior Approval Number:    Date Approved/Denied:   PASRR Number: Pending  Discharge Plan: SNF    Current Diagnoses: Patient Active Problem List   Diagnosis Date Noted  . Abdominal pain 10/31/2017  . Protein-calorie malnutrition, severe 10/31/2017  . Sepsis ([place]) 05/06/2017  . Severe recurrent major depression with psychotic features ([place]) 12/25/2014  . Severe major depression with psychotic features ([place]) 12/24/2014  . Alcohol abuse 12/24/2014  . Malnutrition ([place]) 12/24/2014    Orientation RESPIRATION BLADDER Height & Weight     Self, Place  Normal Incontinent Weight: 112 lb 8 oz (51 kg) Height:  5\' 2"  (157.5 cm)  BEHAVIORAL SYMPTOMS/MOOD NEUROLOGICAL BOWEL NUTRITION STATUS      Continent Diet(Dysphagia 3, thin liquids)  AMBULATORY STATUS COMMUNICATION OF NEEDS Skin   Extensive Assist Verbally Normal                       Personal Care Assistance Level of Assistance  Bathing, Feeding, Dressing Bathing Assistance: Limited assistance Feeding assistance: Independent Dressing Assistance: Limited assistance     Functional Limitations Info             SPECIAL CARE FACTORS FREQUENCY  PT (By licensed PT)     PT Frequency: 5X per week              Contractures Contractures Info: Not present    Additional Factors Info  Code Status, Allergies, Psychotropic  Code Status Info: Full Allergies Info: No known allergies Psychotropic Info: Xanax         Current Medications (11/03/2017):  This is the current hospital active medication list Current Facility-Administered Medications  Medication Dose Route Frequency Provider Last Rate Last Dose  . 0.9 %  sodium chloride infusion   Intravenous Continuous Inez Catalina, MD   Stopped at 11/01/17 1110  . albuterol (PROVENTIL) (2.5 MG/3ML) 0.083% nebulizer solution 2.5 mg  2.5 mg Nebulization Q6H PRN Arta Silence, MD   2.5 mg at 11/02/17 0851  . ALPRAZolam Duanne Moron) tablet 0.25 mg  0.25 mg Oral BID PRN Epifanio Lesches, MD   0.25 mg at 11/02/17 0902  . bisacodyl (DULCOLAX) EC tablet 5 mg  5 mg Oral Daily PRN Arta Silence, MD      . enoxaparin (LOVENOX) injection 40 mg  40 mg Subcutaneous Q24H Gouru, Aruna, MD   40 mg at 11/02/17 2338  . feeding supplement (ENSURE ENLIVE) (ENSURE ENLIVE) liquid 237 mL  237 mL Oral TID BM Epifanio Lesches, MD   237 mL at 11/03/17 0957  . ferrous sulfate tablet 325 mg  325 mg Oral Q breakfast Epifanio Lesches, MD   325 mg at 11/03/17 0956  . folic acid (FOLVITE) tablet 1 mg  1 mg Oral Daily Arta Silence, MD   1 mg at 11/03/17 0957  . morphine 2 MG/ML injection 2 mg  2 mg Intravenous Q4H PRN Gouru, Aruna, MD   2 mg at 11/01/17  1959  . multivitamin with minerals tablet 1 tablet  1 tablet Oral Daily Arta Silence, MD   1 tablet at 11/03/17 0956  . nicotine (NICODERM CQ - dosed in mg/24 hours) patch 21 mg  21 mg Transdermal Daily Epifanio Lesches, MD   21 mg at 11/03/17 0957  . ondansetron (ZOFRAN) tablet 4 mg  4 mg Oral Q6H PRN Arta Silence, MD   4 mg at 11/03/17 0956   Or  . ondansetron (ZOFRAN) injection 4 mg  4 mg Intravenous Q6H PRN Arta Silence, MD   4 mg at 11/02/17 1730  . oxyCODONE (Oxy IR/ROXICODONE) immediate release tablet 5 mg  5 mg Oral Q4H PRN Gouru, Aruna, MD   5 mg at 11/03/17 0957  . senna-docusate (Senokot-S)  tablet 1 tablet  1 tablet Oral QHS PRN Arta Silence, MD      . thiamine (VITAMIN B-1) tablet 100 mg  100 mg Oral Daily Arta Silence, MD   100 mg at 11/03/17 0957  . traMADol (ULTRAM) tablet 50 mg  50 mg Oral Q6H PRN Gouru, Aruna, MD      . vitamin B-12 (CYANOCOBALAMIN) tablet 1,000 mcg  1,000 mcg Oral Daily Arta Silence, MD   1,000 mcg at 11/03/17 6387     Discharge Medications: Please see discharge summary for a list of discharge medications.  Relevant Imaging Results:  Relevant Lab Results:   Additional Information SS# [id number]  Zettie Pho, LCSW

## 2017-11-04 ENCOUNTER — Other Ambulatory Visit: Payer: Self-pay | Admitting: Pathology

## 2017-11-04 LAB — BASIC METABOLIC PANEL
ANION GAP: 6 (ref 5–15)
BUN: 6 mg/dL (ref 6–20)
CALCIUM: 7.1 mg/dL — AB (ref 8.9–10.3)
CO2: 24 mmol/L (ref 22–32)
Chloride: 107 mmol/L (ref 101–111)
Creatinine, Ser: 0.39 mg/dL — ABNORMAL LOW (ref 0.61–1.24)
Glucose, Bld: 131 mg/dL — ABNORMAL HIGH (ref 65–99)
Potassium: 3.9 mmol/L (ref 3.5–5.1)
SODIUM: 137 mmol/L (ref 135–145)

## 2017-11-04 LAB — PROCALCITONIN: Procalcitonin: 1.01 ng/mL

## 2017-11-04 LAB — SURGICAL PATHOLOGY

## 2017-11-04 LAB — MAGNESIUM: MAGNESIUM: 1.8 mg/dL (ref 1.7–2.4)

## 2017-11-04 LAB — PHOSPHORUS: PHOSPHORUS: 2.7 mg/dL (ref 2.5–4.6)

## 2017-11-04 MED ORDER — IBUPROFEN 400 MG PO TABS
600.0000 mg | ORAL_TABLET | Freq: Once | ORAL | Status: AC
  Administered 2017-11-05: 600 mg via ORAL
  Filled 2017-11-04: qty 2

## 2017-11-04 NOTE — Progress Notes (Signed)
[place] [facility] [place] NAME: Nathan Mcintosh    MR#:  [id number]  DATE OF BIRTH:  1955/08/25  SUBJECTIVE: Abdominal pain, poor p.o. intake, nausea, low-grade temperature yesterday.  Because of increasing lactic acid patient antibiotics changed to Zosyn to cover intra-abdominal source.  CHIEF COMPLAINT: He complains of midepigastric abdominal pain.  REVIEW OF SYSTEMS:  CONSTITUTIONAL: Weight loss, abdominal pain  eYES: No blurred or double vision.  EARS, NOSE, AND THROAT: No tinnitus or ear pain.  RESPIRATORY: mild cough, shortness of breath, wheezing or hemoptysis.  CARDIOVASCULAR: No chest pain, orthopnea, edema.  GASTROINTESTINAL: No nausea, vomiting, diarrhea.  Reporting upper abdominal pain.  GENITOURINARY: No dysuria, hematuria.  ENDOCRINE: No polyuria, nocturia,  HEMATOLOGY: No anemia, easy bruising or bleeding SKIN: No rash or lesion. MUSCULOSKELETAL: No joint pain or arthritis.   NEUROLOGIC: No tingling, numbness, weakness.  PSYCHIATRY: No anxiety or depression.   DRUG ALLERGIES:  No Known Allergies  VITALS:  Blood pressure (!) 143/94, pulse (!) 107, temperature 99.3 F (37.4 C), temperature source Oral, resp. rate (!) 30, height 5' 2"  (1.575 m), weight 51 kg (112 lb 8 oz), SpO2 100 %.  PHYSICAL EXAMINATION:  GENERAL:  62 y.o.-year-old patient lying in the bed with no acute distress.  Cachectic severe muscle wasting  EYES: Pupils equal, round, reactive to light and accommodation. No scleral icterus. Extraocular muscles intact.  HEENT: Head atraumatic, normocephalic. Oropharynx and nasopharynx clear.  NECK:  Supple, no jugular venous distention. No thyroid enlargement, no tenderness.  LUNGS: Normal breath sounds bilaterally, no wheezing, rales,rhonchi or crepitation. No use of accessory muscles of respiration.  CARDIOVASCULAR: S1, S2 normal. No murmurs, rubs, or gallops.  ABDOMEN: Soft, epigastric tenderness is present no  rebound tenderness . dressing present in  abdomen., nondistended. Bowel sounds present.  Liver biopsy site looks intact EXTREMITIES: No pedal edema, cyanosis, or clubbing.  NEUROLOGIC: Cranial nerves II through XII are intact. Muscle strength global weakness in all extremities. Sensation intact. Gait not checked.  PSYCHIATRIC: The patient is alert and oriented x 3.  SKIN: No obvious rash, lesion, or ulcer.    LABORATORY PANEL:   CBC Recent Labs  Lab 11/03/17 1225  WBC 10.8*  HGB 8.0*  HCT 24.1*  PLT 146*   ------------------------------------------------------------------------------------------------------------------  Chemistries  Recent Labs  Lab 11/03/17 1225 11/04/17 0336  NA 137 137  K 3.1* 3.9  CL 105 107  CO2 24 24  GLUCOSE 121* 131*  BUN 7 6  CREATININE 0.44* 0.39*  CALCIUM 7.3* 7.1*  MG 1.6* 1.8  AST 127*  --   ALT 95*  --   ALKPHOS 353*  --   BILITOT 0.8  --    ------------------------------------------------------------------------------------------------------------------  Cardiac Enzymes Recent Labs  Lab 10/30/17 2212  TROPONINI <0.03   ------------------------------------------------------------------------------------------------------------------  RADIOLOGY:  Dg Chest Port 1 View  Result Date: 11/03/2017 CLINICAL DATA:  Former smoker.  Tachypneic. EXAM: PORTABLE CHEST 1 VIEW COMPARISON:  October 30, 2017 FINDINGS: The heart size and mediastinal contours are within normal limits. Both lungs are clear. The visualized skeletal structures are unremarkable. IMPRESSION: No active disease. Electronically Signed   By: Dorise Bullion III M.D   On: 11/03/2017 15:02    EKG:   Orders placed or performed during the hospital encounter of 10/30/17  . ED EKG  . ED EKG    ASSESSMENT AND PLAN:     A/P: 89M AMS/encephalopathy, stomach/liver mass. #1 acute encephalopathy resolved. 2.  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  Patient had a liver biopsy but results are pending.  Patient needs to have PET scan as an outpatient as per oncology recommendation.  After the biopsy patient had tumor markers.  At this time patient is really fragile, palliative care met with family today and he wanted to be DNR and DNI and he also told that he does not want his daughter to know about that he is here.  And he  said that he does not want his daughter to know. #3 neoplasm related pain.  Continue as needed narcotics. 4.  Fever, worsening sepsis: Chest x-ray clear, follow blood cultures, urine cultures that were done yesterday, continue Zosyn.  Encourage p.o. intake, decrease IV fluids.  Prognosis expected to be poor.     All the records are reviewed and case discussed with Care Management/Social Workerr. Management plans discussed with the patient, granddaughter  And granddaughter will inform patient's daughter Nathan Mcintosh to call us back for an update they are in agreement.  CODE STATUS: fc, daughter daughter is the healthcare POA  TOTAL TIME TAKING CARE OF THIS PATIENT: 36  minutes.   POSSIBLE D/C IN 2DAYS, DEPENDING ON CLINICAL CONDITION.  Note: This dictation was prepared with Dragon dictation along with smaller phrase technology. Any transcriptional errors that result from this process are unintentional.   Epifanio Lesches M.D on 11/04/2017 at 11:45 AM  Between 7am to 6pm - Pager - [phone] After 6pm go to [email or web address] - password [email or web address] [facility]  Office  [phone]  CC: Primary care physician; Patient, No Pcp Per

## 2017-11-04 NOTE — Plan of Care (Signed)

## 2017-11-04 NOTE — Progress Notes (Signed)
MEDICATION RELATED CONSULT NOTE - INITIAL   Pharmacy Consult for electrolyte management Indication: hypokalemia  No Known Allergies  Patient Measurements: Height: 5\' 2"  (157.5 cm) Weight: 112 lb 8 oz (51 kg) IBW/kg (Calculated) : 54.6 Adjusted Body Weight:   Vital Signs: Temp: 99.3 F (37.4 C) (04/29 0413) Temp Source: Oral (04/29 0413) BP: 143/94 (04/29 0413) Pulse Rate: 107 (04/29 0413) Intake/Output from previous day: 04/28 0701 - 04/29 0700 In: 3122.5 [I.V.:922.5; IV Piggyback:2200] Out: -  Intake/Output from this shift: No intake/output data recorded.  Labs: Recent Labs    11/03/17 1048 11/03/17 1225 11/04/17 0336  WBC 11.7* 10.8*  --   HGB 8.8* 8.0*  --   HCT 27.1* 24.1*  --   PLT 160 146*  --   APTT  --  29  --   CREATININE  --  0.44* 0.39*  MG  --  1.6* 1.8  PHOS  --  1.1* 2.7  ALBUMIN  --  1.8*  --   PROT  --  5.6*  --   AST  --  127*  --   ALT  --  95*  --   ALKPHOS  --  353*  --   BILITOT  --  0.8  --    Estimated Creatinine Clearance: 70.8 mL/min (A) (by C-G formula based on SCr of 0.39 mg/dL (L)).   Microbiology: Recent Results (from the past 720 hour(s))  Blood Culture (routine x 2)     Status: None (Preliminary result)   Collection Time: 10/30/17 12:49 AM  Result Value Ref Range Status   Specimen Description BLOOD BLOOD LEFT FOREARM  Final   Special Requests   Final    BOTTLES DRAWN AEROBIC AND ANAEROBIC Blood Culture results may not be optimal due to an inadequate volume of blood received in culture bottles   Culture   Final    NO GROWTH 3 DAYS Performed at [facility], [address] [place], [place] [address]    Report Status PENDING  Incomplete  Blood Culture (routine x 2)     Status: Abnormal   Collection Time: 10/30/17 12:49 AM  Result Value Ref Range Status   Specimen Description   Final    BLOOD RIGHT ANTECUBITAL Performed at [facility], [address], [place], [place] [address]    Special Requests    Final    BOTTLES DRAWN AEROBIC AND ANAEROBIC Blood Culture results may not be optimal due to an inadequate volume of blood received in culture bottles Performed at [facility], [address] [place], [place] [address]    Culture  Setup Time   Final    GRAM POSITIVE COCCI AEROBIC BOTTLE ONLY CRITICAL RESULT CALLED TO, READ BACK BY AND VERIFIED WITH: DAVID BESANTI @ 9678 ON 11/01/2017 BY CAF    Culture (A)  Final    STAPHYLOCOCCUS SPECIES (COAGULASE NEGATIVE) THE SIGNIFICANCE OF ISOLATING THIS ORGANISM FROM A SINGLE SET OF BLOOD CULTURES WHEN MULTIPLE SETS ARE DRAWN IS UNCERTAIN. PLEASE NOTIFY THE MICROBIOLOGY DEPARTMENT WITHIN ONE WEEK IF SPECIATION AND SENSITIVITIES ARE REQUIRED. Performed at [facility], [place] [address], [place], [place] [address]    Report Status 11/03/2017 FINAL  Final  Blood Culture ID Panel (Reflexed)     Status: Abnormal   Collection Time: 10/30/17 12:49 AM  Result Value Ref Range Status   Enterococcus species NOT DETECTED NOT DETECTED Final   Listeria monocytogenes NOT DETECTED NOT DETECTED Final   Staphylococcus species DETECTED (A)  NOT DETECTED Final    Comment: Methicillin (oxacillin) resistant coagulase negative staphylococcus. Possible blood culture contaminant (unless isolated from more than one blood culture draw or clinical case suggests pathogenicity). No antibiotic treatment is indicated for blood  culture contaminants. CRITICAL RESULT CALLED TO, READ BACK BY AND VERIFIED WITH: DAVID BESANTI @ 0133 ON 11/01/2017 BY CAF    Staphylococcus aureus NOT DETECTED NOT DETECTED Final   Methicillin resistance DETECTED (A) NOT DETECTED Final    Comment: CRITICAL RESULT CALLED TO, READ BACK BY AND VERIFIED WITH: DAVID BESANTI @ 0133 ON 11/01/2017 BY CAF    Streptococcus species NOT DETECTED NOT DETECTED Final   Streptococcus agalactiae NOT DETECTED NOT DETECTED Final   Streptococcus pneumoniae NOT DETECTED NOT DETECTED Final   Streptococcus  pyogenes NOT DETECTED NOT DETECTED Final   Acinetobacter baumannii NOT DETECTED NOT DETECTED Final   Enterobacteriaceae species NOT DETECTED NOT DETECTED Final   Enterobacter cloacae complex NOT DETECTED NOT DETECTED Final   Escherichia coli NOT DETECTED NOT DETECTED Final   Klebsiella oxytoca NOT DETECTED NOT DETECTED Final   Klebsiella pneumoniae NOT DETECTED NOT DETECTED Final   Proteus species NOT DETECTED NOT DETECTED Final   Serratia marcescens NOT DETECTED NOT DETECTED Final   Haemophilus influenzae NOT DETECTED NOT DETECTED Final   Neisseria meningitidis NOT DETECTED NOT DETECTED Final   Pseudomonas aeruginosa NOT DETECTED NOT DETECTED Final   Candida albicans NOT DETECTED NOT DETECTED Final   Candida glabrata NOT DETECTED NOT DETECTED Final   Candida krusei NOT DETECTED NOT DETECTED Final   Candida parapsilosis NOT DETECTED NOT DETECTED Final   Candida tropicalis NOT DETECTED NOT DETECTED Final    Comment: Performed at [facility], [address] [place], [place] [address]  MRSA PCR Screening     Status: None   Collection Time: 11/03/17  1:35 PM  Result Value Ref Range Status   MRSA by PCR NEGATIVE NEGATIVE Final    Comment:        The GeneXpert MRSA Assay (FDA approved for NASAL specimens only), is one component of a comprehensive MRSA colonization surveillance program. It is not intended to diagnose MRSA infection nor to guide or monitor treatment for MRSA infections. Performed at [facility], [address] [place],  [address]     Medical History: History reviewed. No pertinent past medical history.  Medications:  Infusions:  . 0.9 % NaCl with KCl 20 mEq / L 75 mL/hr at 11/04/17 0723  . piperacillin-tazobactam (ZOSYN)  IV 3.375 g (11/04/17 0456)  . vancomycin Stopped (11/04/17 0007)    Assessment: 62 yom cc abdominal pain with acute encephalopathy. Lactate elevated d/t liver disease/stomach mass (no obvious source of  infection), although patient is on Vancomycin and Zosyn now for sepsis of unknown source.  4/29 K=3.9, Mag=1.8, Phos=2.7  Goal of Therapy:  K 3.5 to 5 Ca 8.9 to 10.3 Mg 1.7 to 2.4 Phos 2.5 to 4.6  Plan:  Provider ordered NS with 20 mEq/L at 75 mL/hr which will supply approximately 36 mEq/24 hours. No additional supplementation at this time. Will follow up on AM labs.  Paulina Fusi, PharmD, BCPS 11/04/2017 9:43 AM

## 2017-11-04 NOTE — Progress Notes (Addendum)
Clinical Social Worker (CSW) met with patient to discuss discharge planning to SNF. Patient was alert and oriented to person, place, time and situation. CSW spoke with patient about the SNF process with Medicaid and that we would need information about further income regarding disability. Patient refused to give CSW any information regarding income and stated that he will not go to a facility. CSW explained that the therapists have recommended that he go to a facility for rehab. Patient adamantly refusing to go to a facility. He states that he will go home and be fine. CSW asked if he had support at home or needed home health. Patient again refused to give CSW any information and stated that he will be fine at home and would not need anything. CSW notified Hassan Rowan, RN Case Manager that patient is refusing SNF and home health.   CSW received call from unit secretary that patient's friend Mardene Celeste was here and wanted to speak with CSW. CSW went to patient's room and no family or friend were there. Patient was in bed with a blanket over his head. CSW notified nurse that she was there to meet with Mardene Celeste but she has already left.   Annamaria Boots MSW, [place] [phone]

## 2017-11-04 NOTE — Consult Note (Addendum)
Consultation Note Date: 11/04/2017   Patient Name: Nathan Mcintosh  DOB: [birth year]  MRN: [id number]  Age / Sex: 62 y.o., male  PCP: Patient, No Pcp Per Referring Physician: Epifanio Lesches, MD  Reason for Consultation: Establishing goals of care  HPI/Patient Profile: 62 y.o. male admitted on 10/30/2017 from home with abdominal pain and altered mental status. He has a past known medical history of hypertension, alcohol abuse, and tobacco use. During his Ed course patient was found to be alert to person and place, but not to month or year. He was a poor historian, and unable to provide accurate timeframe of his symptoms. Pt endorsed fatigue/malaise/generalized weakness, unsteady gait, lightheadedness on standing, and falls. At first he reported his symptoms had been going on for several weeks, but then he stated it has been going on for several months. He initially endorsed LOC, but then denied LOC later. He endorsed decreased exercise tolerance x~28mo and states he has been spending more time lying down. He endorsed diffuse AP, worst in the RUQ, and daily N/V, which he also believes has been ongoing x~268moHe endorsed decreased PO intake of food and weight loss, her reported he has been drinking soda and water. According to the patient he has been living with a friend for the past 2-86m31morior to that, he stated he was living with another friend, but that friend moved. He stated he used to smoke and drink, but does not anymore. He initially said he quit drinking ~1wk ago, but then said he quit drinking ~1-54mo70mo. He has not noticed any jaundice/icterus or edema. Pt denied F/C, diarrhea, CP, SOB, cough, hemoptysis, palpitations, diaphoresis, night sweats, rigors, HA, blurred vision, vertigo, urinary symptoms. He was unable to provide any further relevant information. 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 He has a pending liver biopsy (11/01/17). Oncology has also seen patient. Palliative Medicine consulted for goals of care discussion.   Clinical Assessment and Goals of Care: I have reviewed medical records including lab results, imaging, Epic notes, and MAR, received report from the bedside RN, and assessed the patient. I then met at the bedside with patient to discuss diagnosis prognosis, GOC, EOL wishes, disposition and options. No family was present at this time. Patient is alert and oriented. He was able to verify name, dob, location, year, and why he was hospitalized. Offered to contact his daughter, Nathan Mcintosh is listed as a contact however, patient verbalized his wishes for her not to be contacted stating "she does not have anything to do with my health and my life. I do not want her being called about anything, I make my own decisions."  During conversation patient continued to keep covers over his head. When asked if he could remove them he advised no, he was not in the mood to look at anyone but would talk to me with his head covered if I wanted to talk with him.   I introduced Palliative  Medicine as specialized medical care for people living with serious illness. It focuses on providing relief from the symptoms and stress of a serious illness. The goal is to improve quality of life for both the patient and the family.  We discussed a brief life review of the patient.  Patient states throughout the years he has had numerous jobs it just depending on who would hire him.  He states he has about 3 or 4 children, however he does not know where they all are. He really only keeps in contact with [name] and states that isn't much.  He states he has not worked in some time now.  He reports a history of drinking and smoking cigarettes.  He states he has not smoked a cigarette in over 2 weeks and he has not drank any alcohol for about 1 to 2 months.   He states he just having had the funds to do this and he knew something was wrong and he was sick.  As far as functional and nutritional status patient states over the past 2 to 3 months he has been living with a friend.  He states he does not have his own home and is pretty much homeless.  For the past 2 to 3 weeks he has been laying around feeling weak, his stomach has been hurting all the time, he feels as though he probably is nauseated or either vomits every day, and just does not feel like doing anything.  He reports his appetite has not been the best due to the pain and nausea, and he also does not have much of an appetite.  He states his friend is the one that brought him to the hospital.  Reports that he can independently walk without any assistive devices just feels really weak and before coming to the hospital he had several falls.  He agrees that he has lost some weight but unsure how much.  He said he really started noticing some changes after New Year's however was unable to identify exactly what changes other than pain and decrease in energy.  When asked regarding his family support system, patient states he has not talking to his daughter about how he feels and what has been going on.  He reports she has done well for herself and has a family she could care less what is going on with me.  Again he states, and I do not want her to know was going on with me.  We discussed his current illness and what it means in the larger context of his on-going co-morbidities.  Natural disease trajectory and expectations at EOL were discussed.  Patient was able to summarize conversations with providers since being hospitalized.  He states he knows he has pneumonia, a blood infection, and may be cancer but was not sure if it was really cancer.  We discussed his current plan of care at this moment regarding biopsy and other treatments for sepsis such as antibiotics.  He verbalizes awareness of these treatments and  interventions. We discussed once the Oncologist received the liver biopsy results and further information they would be talking to him more in depth regarding his treatment options. He questioned "treatment options" and I explained I was not the Oncologist however that could mean surgery, chemotherapy, or other forms of therapy targeting the cancer. Patient at that point removed covers from his head and stated in a stern voice: If you how people think I am going to do any type  of chemotherapy or let anyone cut on me you are crazy!  I do not care if I die, I am not doing none of that!" Patient was supported and encouraged to express his feelings and elaborate on his thoughts and he covered his head back up and stated, "I can make my own decisions about what I want and don't want". When asked if he had fears or concerns he replied "No, I just know what I am not going to do and that's all yall need to know."   I attempted to elicit values and goals of care important to the patient.    The difference between aggressive medical intervention and comfort care was considered in light of the patient's goals of care. Patient replied "No comment". When asked what he was replying no comment to he stated I already told you what I wasn't going to do.  I attempted to discuss with patient his plans at discharge, he stated the only plans I have is to go home.  I attempted to inquire if he meant to go home with his friend whom he was living with prior to admission, however patient stated that was no ones business.  Attempted to let patient know the hospital staff was here to make sure he received the best care was discharged with a care plan in place.  Patient verbalized I am going home when I leave here whether it be to my friend's house or to the streets.  He stated "I will not be going to anybody's rehab or nursing home and nobody will be coming to wherever I chose to live, end of conversation".  Advanced directives,  concepts specific to code status, artifical feeding and hydration, and rehospitalization were considered and discussed.  When discussing advanced directives, and who he would designate as his healthcare power of attorney if he was unable to make decisions he voiced I guess it would be my daughter [name].  When asked if he would like for someone to come in and complete this documentation he declined.  He replied, " I do not need any documentation because you do not need to be letting her know anything about me, unless I die."  We then discussed his wishes in regards to his code status.  Patient verbalized that he would not want anyone present on his chest or to be placed on any form of life support or machines.  He stated if I die I die.  When discussing artificial feeding and hydration patient replied no comment.  He continued to keep his head covered during this conversation except for when he made the comment "if I die I die".   Hospice and Palliative Care services outpatient were explained and offered. I discussed the differences in both services given patient verbalized his dis-interest in potential treatment for cancer. He politely declined either services and verbalized that he already expressed that no one would be coming to see him once he got out of here.   I attempted to discuss with patient the need for family support during this time as he is very fragile in regards to his health.  I expressed concern in regards to his request of not contacting his daughter and discussing his current condition.  Patient remained silent for a few minutes and then later stated I have already told you not to call my daughter and that is that.  Given patient is alert and oriented x3, I will continue to support his wishes and not reach  out to his daughter.  I did advise patient that if he  changed his mind that I would be more than happy to contact her and discuss with her what is going on with him.  Patient replied "no  comment".  I asked if there was anyone else he would like for me to contact to discuss his care with and he stated there was not.  Questions and concerns were addressed. Patient was encouraged to call with questions or concerns.  PMT will continue to support holistically.  Discussed with Dr. Vianne Bulls and bedside RN.   PATIENT is alert and oriented x3 and able to express wishes at this time. However, if patient is unable to make decisions his daughter Lason Eveland would be next of kin for decisions.     SUMMARY OF RECOMMENDATIONS    FULL CODE: Although patient has expressed not wanting to undergo CPR or intubation/mechanical ventilation I am not convinced that patient is fully aware of his health conditions and/or completely processing all information.  Would recommend patient have a psychiatric evaluation to determine level of competency.  She is alert and oriented during conversation however based on his responses and demeanor this would warrant further evaluation.  He does have a daughter, Jonnathan Birman, however he has made it known several times during my meeting that the healthcare team was not to contact her in regards to his health or decision making.  Patient is deemed appropriate to make own decisions during conversations and his wishes would be supported.  If not I would highly recommend involving his daughter in his care. This will be important also in regards to disposition, given patient reports potentially being homeless and PT is recommending SNF.   Agree with CSW attempting to facilitate SNF placement versus home with assistance.  Patient verbalized pain is being well controlled on current regimen.  Currently has not received any pain medication since yesterday morning.  Palliative medicine will continue to support patient and medical team during hospitalization.  Code Status/Advance Care Planning:  Full code until further mental capacity assessment is completed or verification  by attending provider.   Palliative Prophylaxis:   Aspiration, Eye Care and Frequent Pain Assessment  Additional Recommendations (Limitations, Scope, Preferences):  Full Scope Treatment-continue to treat the treatable.   Psycho-social/Spiritual:   Desire for further Chaplaincy support:NO  Additional Recommendations: Formal Capacity Evaluation  Prognosis:   Unable to determine-guarded to poor in the setting of severe protein calorie malnutrition, alcohol abuse, poor p.o. intake, falls, generalized weakness, pain, and metastatic cancer.  Discharge Planning: To Be Determined      Primary Diagnoses: Present on Admission: . Abdominal pain   I have reviewed the medical record, interviewed the patient and family, and examined the patient. The following aspects are pertinent.  History reviewed. No pertinent past medical history. Social History   Socioeconomic History  . Marital status: Single    Spouse name: Not on file  . Number of children: Not on file  . Years of education: Not on file  . Highest education level: Not on file  Occupational History  . Not on file  Social Needs  . Financial resource strain: Not on file  . Food insecurity:    Worry: Not on file    Inability: Not on file  . Transportation needs:    Medical: Not on file    Non-medical: Not on file  Tobacco Use  . Smoking status: Former Smoker    Packs/day: 1.00  Years: 30.00    Pack years: 30.00    Last attempt to quit: 12/25/2014    Years since quitting: 2.8  . Smokeless tobacco: Never Used  Substance and Sexual Activity  . Alcohol use: Not Currently    Alcohol/week: 2.4 - 3.0 oz    Types: 4 - 5 Cans of beer per week  . Drug use: No  . Sexual activity: Not Currently  Lifestyle  . Physical activity:    Days per week: Not on file    Minutes per session: Not on file  . Stress: Not on file  Relationships  . Social connections:    Talks on phone: Not on file    Gets together: Not on file     Attends religious service: Not on file    Active member of club or organization: Not on file    Attends meetings of clubs or organizations: Not on file    Relationship status: Not on file  Other Topics Concern  . Not on file  Social History Narrative  . Not on file   History reviewed. No pertinent family history. Scheduled Meds: . enoxaparin (LOVENOX) injection  40 mg Subcutaneous Q24H  . feeding supplement (ENSURE ENLIVE)  237 mL Oral TID BM  . ferrous sulfate  325 mg Oral Q breakfast  . folic acid  1 mg Oral Daily  . multivitamin with minerals  1 tablet Oral Daily  . nicotine  21 mg Transdermal Daily  . thiamine  100 mg Oral Daily  . vitamin B-12  1,000 mcg Oral Daily   Continuous Infusions: . piperacillin-tazobactam (ZOSYN)  IV 3.375 g (11/04/17 1208)  . vancomycin Stopped (11/04/17 1207)   PRN Meds:.albuterol, ALPRAZolam, bisacodyl, morphine injection, ondansetron **OR** ondansetron (ZOFRAN) IV, oxyCODONE, senna-docusate, traMADol Medications Prior to Admission:  Prior to Admission medications   Medication Sig Start Date End Date Taking? Authorizing Provider  albuterol (PROVENTIL HFA;VENTOLIN HFA) 108 (90 Base) MCG/ACT inhaler Inhale 2 puffs into the lungs every 4 (four) hours as needed for wheezing. 09/12/16  Yes Marylene Land, NP  amoxicillin-clavulanate (AUGMENTIN) 875-125 MG tablet Take 1 tablet by mouth every 12 (twelve) hours. Patient not taking: Reported on 10/30/2017 05/08/17   Fritzi Mandes, MD  metoprolol tartrate (LOPRESSOR) 25 MG tablet Take 1 tablet (25 mg total) by mouth 2 (two) times daily. Patient not taking: Reported on 10/30/2017 05/08/17   Fritzi Mandes, MD   No Known Allergies Review of Systems  Constitutional: Positive for activity change, appetite change, fatigue and unexpected weight change.  Gastrointestinal: Positive for nausea.  Neurological: Positive for weakness.  All other systems reviewed and are negative.   Physical Exam  Constitutional: He is  oriented to person, place, and time. He appears cachectic. He has a sickly appearance.  Cardiovascular: Normal rate, regular rhythm, normal heart sounds, intact distal pulses and normal pulses.  Pulmonary/Chest: Effort normal. He has decreased breath sounds.  Abdominal: Soft. Bowel sounds are normal.  Musculoskeletal:  Generalized weakness   Neurological: He is alert and oriented to person, place, and time.  Skin: Skin is warm and dry.  Psychiatric: His speech is normal. Thought content normal. His affect is angry and blunt. He is agitated and withdrawn. Cognition and memory are normal.  Nursing note and vitals reviewed.   Vital Signs: BP (!) 124/98 (BP Location: Right Wrist)   Pulse (!) 104   Temp 98.5 F (36.9 C) (Oral)   Resp 18   Ht _0  (1.575 m)   Wt 51  kg (112 lb 8 oz)   SpO2 100%   BMI 20.58 kg/m  Pain Scale: 0-10 POSS *See Group Information*: 1-Acceptable,Awake and alert Pain Score: 0-No pain   SpO2: SpO2: 100 % O2 Device:SpO2: 100 % O2 Flow Rate: .O2 Flow Rate (L/min): 2 L/min  IO: Intake/output summary:   Intake/Output Summary (Last 24 hours) at 11/04/2017 1346 Last data filed at 11/04/2017 1207 Gross per 24 hour  Intake 2056.25 ml  Output -  Net 2056.25 ml    LBM: Last BM Date: 11/04/17 Baseline Weight: Weight: 52.2 kg (115 lb) Most recent weight: Weight: 51 kg (112 lb 8 oz)     Palliative Assessment/Data:PPS 40%   Time In: 0930 Time Out: 1045 Time Total: 75 min  Greater than 50%  of this time was spent counseling and coordinating care related to the above assessment and plan.  Signed by: Alda Lea, NP-BC Palliative Medicine Team  Phone: [phone] Fax: [phone]  Please contact Palliative Medicine Team phone at [phone] for questions and concerns.  For individual provider: See Shea Evans

## 2017-11-05 DIAGNOSIS — C801 Malignant (primary) neoplasm, unspecified: Secondary | ICD-10-CM

## 2017-11-05 LAB — BASIC METABOLIC PANEL
Anion gap: 7 (ref 5–15)
BUN: 10 mg/dL (ref 6–20)
CALCIUM: 7.1 mg/dL — AB (ref 8.9–10.3)
CO2: 22 mmol/L (ref 22–32)
CREATININE: 0.64 mg/dL (ref 0.61–1.24)
Chloride: 109 mmol/L (ref 101–111)
GFR calc Af Amer: >60 mL/min (ref >60)
GLUCOSE: 84 mg/dL (ref 65–99)
Potassium: 3.5 mmol/L (ref 3.5–5.1)
Sodium: 138 mmol/L (ref 135–145)

## 2017-11-05 LAB — CULTURE, BLOOD (ROUTINE X 2): CULTURE: NO GROWTH

## 2017-11-05 LAB — PHOSPHORUS: Phosphorus: 1.8 mg/dL — ABNORMAL LOW (ref 2.5–4.6)

## 2017-11-05 LAB — PROCALCITONIN: Procalcitonin: 2.28 ng/mL

## 2017-11-05 LAB — MAGNESIUM: Magnesium: 1.8 mg/dL (ref 1.7–2.4)

## 2017-11-05 MED ORDER — POTASSIUM PHOSPHATES 15 MMOLE/5ML IV SOLN
15.0000 mmol | Freq: Once | INTRAVENOUS | Status: AC
  Administered 2017-11-05: 15 mmol via INTRAVENOUS
  Filled 2017-11-05: qty 5

## 2017-11-05 MED ORDER — GUAIFENESIN-DM 100-10 MG/5ML PO SYRP
5.0000 mL | ORAL_SOLUTION | ORAL | Status: DC | PRN
  Administered 2017-11-06: 23:00:00 5 mL via ORAL
  Filled 2017-11-05 (×2): qty 5

## 2017-11-05 NOTE — Progress Notes (Signed)
[facility] NAME: Nathan Mcintosh    MR#:  [id number]  DATE OF BIRTH:  [birth year]  SUBJECTIVE: Abdominal pain, fever up to 101 Fyesterday.  No shortness of breath.  CHIEF COMPLAINT: He complains of midepigastric abdominal pain.  REVIEW OF SYSTEMS:  CONSTITUTIONAL: Weight loss, abdominal pain  eYES: No blurred or double vision.  EARS, NOSE, AND THROAT: No tinnitus or ear pain.  RESPIRATORY: No shortness of breath or cough. CARDIOVASCULAR: No chest pain, orthopnea, edema.  GASTROINTESTINAL: nausea, vomiting, diarrhea.  Reporting upper abdominal pain.  GENITOURINARY: No dysuria, hematuria.  ENDOCRINE: No polyuria, nocturia,  HEMATOLOGY: No anemia, easy bruising or bleeding SKIN: No rash or lesion. MUSCULOSKELETAL: No joint pain or arthritis.   NEUROLOGIC: No tingling, numbness, weakness.  PSYCHIATRY: No anxiety or depression.   DRUG ALLERGIES:  No Known Allergies  VITALS:  Blood pressure 101/72, pulse 94, temperature 97.7 F (36.5 C), temperature source Oral, resp. rate 17, height 5\' 2"  (1.575 m), weight 51 kg (112 lb 8 oz), SpO2 100 %.  PHYSICAL EXAMINATION:  GENERAL:  62 y.o.-year-old patient lying in the bed with no acute distress.  Cachectic severe muscle wasting  EYES: Pupils equal, round, reactive to light and accommodation. No scleral icterus. Extraocular muscles intact.  HEENT: Head atraumatic, normocephalic. Oropharynx and nasopharynx clear.  NECK:  Supple, no jugular venous distention. No thyroid enlargement, no tenderness.  LUNGS: Normal breath sounds bilaterally, no wheezing,  No rales,rhonchi or crepitation. No use of accessory muscles of respiration.  CARDIOVASCULAR: S1, S2 normal. No murmurs, rubs, or gallops.  ABDOMEN: Soft, epigastric tenderness is present no rebound tenderness . dressing present in  abdomen., nondistended. Bowel sounds present.  Liver biopsy site looks intact EXTREMITIES: No pedal  edema, cyanosis, or clubbing.  NEUROLOGIC: Cranial nerves II through XII are intact. Muscle strength global weakness in all extremities. Sensation intact. Gait not checked.  PSYCHIATRIC: The patient is alert and oriented x 3.  SKIN: No obvious rash, lesion, or ulcer.    LABORATORY PANEL:   CBC Recent Labs  Lab 11/03/17 1225  WBC 10.8*  HGB 8.0*  HCT 24.1*  PLT 146*   ------------------------------------------------------------------------------------------------------------------  Chemistries  Recent Labs  Lab 11/03/17 1225  11/05/17 0418  NA 137   < > 138  K 3.1*   < > 3.5  CL 105   < > 109  CO2 24   < > 22  GLUCOSE 121*   < > 84  BUN 7   < > 10  CREATININE 0.44*   < > 0.64  CALCIUM 7.3*   < > 7.1*  MG 1.6*   < > 1.8  AST 127*  --   --   ALT 95*  --   --   ALKPHOS 353*  --   --   BILITOT 0.8  --   --    < > = values in this interval not displayed.   ------------------------------------------------------------------------------------------------------------------  Cardiac Enzymes Recent Labs  Lab 10/30/17 2212  TROPONINI <0.03   ------------------------------------------------------------------------------------------------------------------  RADIOLOGY:  No results found.  EKG:   Orders placed or performed during the hospital encounter of 10/30/17  . ED EKG  . ED EKG    ASSESSMENT AND PLAN:     A/P: 29M AMS/encephalopathy, stomach/liver mass. #1 acute encephalopathy resolved. 2.  Abdominal pain found to have exophytic large gastric mass about 10 cm with liver mets, peritoneal mets.  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 Prognosis really poor, today patient agreed to talk to his daughter.  Yesterday he refused to contact his family./daughter..    neoplasm related pain.  Continue as needed narcotics. 4.  Fever, worsening sepsis: Chest x-ray clear, blood cultures have been negative.   Patient septic because of underlying cancer, prognosis really poor, continue empiric antibiotics, palliative care is following, at this time he is not decided about comfort care.  But patient is to be comfort care and possibly hospice home.   appreciate oncology following, discussed with Dr. Janese Banks.   She  is going to talk to him and also discussed with daughter today.  All the records are reviewed and case discussed with Care Management/Social Workerr. Management plans discussed with the patient, granddaughter  And granddaughter will inform patient's daughter [name] to call us back for an update they are in agreement.  CODE STATUS: fc, daughter daughter is the healthcare POA  TOTAL TIME TAKING CARE OF THIS PATIENT: 36  minutes.   POSSIBLE D/C IN 2DAYS, DEPENDING ON CLINICAL CONDITION.  Note: This dictation was prepared with Dragon dictation along with smaller phrase technology. Any transcriptional errors that result from this process are unintentional.   Epifanio Lesches M.D on 11/05/2017 at 12:40 PM  Between 7am to 6pm - Pager - [phone] After 6pm go to [email or web address] - password [email or web address]  Office  [phone]  CC: Primary care physician; Patient, No Pcp Per

## 2017-11-05 NOTE — Progress Notes (Signed)
Physical Therapy Treatment Patient Details Name: Nathan Mcintosh MRN: [id number] DOB: 1955/12/31 Today's Date: 11/05/2017    History of Present Illness pt is a 62 y.o M with dx of abdominal pain addmitted on 10/30/2017. male with a known history of HTN, EtOH abuse, tobacco use D/O, malnourishment/cachexia who p/w AMS. The pt is alert to person and place, but not to month or year. pt is a poor historian regarding living situation and prior level of function. he is only Aware of person and place.     PT Comments    Pt in bed, generally flat affect with little interaction but agrees to session.  To edge of bed with light min assist to bring hips to edge of bed.  Stood with min a +1 and was able to ambulate today around unit with min a +1.  +2 assist used as he had BUE IV's runnig and O2.  While gait was generally unsteady with walker, he had no LOB"s that required assist to recover and tolerated distance well with little fatigue.    Original recommendation was for SNF.  Pt has demonstrated +1 min assist for mobility skills for household ambulation distances.  Will update discharge recommendations at this time.  Pt will need +1 assist for all mobility for safety.   Follow Up Recommendations  HHPT, 24 hour assist, assist with mobility     Equipment Recommendations  Rolling walker with 5" wheels    Recommendations for Other Services OT consult     Precautions / Restrictions Precautions Precautions: Fall Restrictions Weight Bearing Restrictions: No    Mobility  Bed Mobility Overal bed mobility: Needs Assistance Bed Mobility: Supine to Sit     Supine to sit: Min guard;Min assist        Transfers Overall transfer level: Needs assistance Equipment used: Rolling walker (2 wheeled) Transfers: Sit to/from Stand Sit to Stand: Min assist;+2 safety/equipment            Ambulation/Gait Ambulation/Gait assistance: Min assist;+2 safety/equipment Ambulation Distance (Feet): 160  Feet Assistive device: Rolling walker (2 wheeled)     Gait velocity interpretation: <1.8 ft/sec, indicate of risk for recurrent falls General Gait Details: generally unsteady but no LOB's requiring assist.  +2 assist due to IV in BUE and O2 management.   Stairs             Wheelchair Mobility    Modified Rankin (Stroke Patients Only)       Balance Overall balance assessment: Needs assistance Sitting-balance support: Feet supported Sitting balance-Leahy Scale: Good     Standing balance support: Bilateral upper extremity supported Standing balance-Leahy Scale: Poor                              Cognition Arousal/Alertness: Awake/alert Behavior During Therapy: Flat affect Overall Cognitive Status: Difficult to assess                                 General Comments: minimal verbalizations during session,  seemed disinterested in therapy but participated.      Exercises Other Exercises Other Exercises: seated ankle pumps and LAQ x 10 without support of LOB    General Comments        Pertinent Vitals/Pain Pain Assessment: No/denies pain    Home Living  Prior Function            PT Goals (current goals can now be found in the care plan section) Progress towards PT goals: Progressing toward goals    Frequency    Min 2X/week      PT Plan Discharge plan needs to be updated    Co-evaluation              AM-PAC PT "6 Clicks" Daily Activity  Outcome Measure  Difficulty turning over in bed (including adjusting bedclothes, sheets and blankets)?: None Difficulty moving from lying on back to sitting on the side of the bed? : Unable Difficulty sitting down on and standing up from a chair with arms (e.g., wheelchair, bedside commode, etc,.)?: Unable Help needed moving to and from a bed to chair (including a wheelchair)?: A Little Help needed walking in hospital room?: A Little Help needed  climbing 3-5 steps with a railing? : A Lot 6 Click Score: 14    End of Session Equipment Utilized During Treatment: Gait belt;Oxygen Activity Tolerance: Patient tolerated treatment well Patient left: in chair;with chair alarm set;with call bell/phone within reach Nurse Communication: Mobility status       Time: 1141-1157 PT Time Calculation (min) (ACUTE ONLY): 16 min  Charges:  $Gait Training: 8-22 mins                    G Codes:       Chesley Noon, PTA 11/05/17, 12:06 PM

## 2017-11-05 NOTE — Progress Notes (Signed)
Pt had temperature of 101.1 with HR in the 120s for evening vitals, Maier MD contacted and orders for Motrin 600 mg placed with verbal read back. Motrin was administered and has had a therapeutic effect, current temperature 97.7 and HR 95. Will continue to monitor.

## 2017-11-05 NOTE — Progress Notes (Signed)
Patient's friend Mardene Celeste called [employer] representative (CSW) this morning to discuss D/C plan. Per Mardene Celeste patient is refusing to go to SNF and Mardene Celeste stated that she can't make him go. CSW made Mardene Celeste aware that patient also refused SNF with CSW. Plan is for patient to D/C home.   [employer], LCSW [phone]

## 2017-11-05 NOTE — Progress Notes (Signed)
Daily Progress Note   Patient Name: Nathan Mcintosh       Date: 11/05/2017 DOB: 1956/02/17  Age: 62 y.o. MRN#: [id number] Attending Physician: Epifanio Lesches, MD Primary Care Physician: Patient, No Pcp Per Admit Date: 10/30/2017  Reason for Consultation/Follow-up: Establishing goals of care  Subjective: Patient is awake and alert and oriented x3. He is sitting up preparing for lunch and recently ambulated to bathroom. He appears to be in a much better mood today and more talkative. He denies pain or discomfort at this time. Daughter Nathan Mcintosh, called while with patient. We were able to speak with her together. Yesterday he verbalized not wanting staff to communicate with her, today he agreed that it was ok to speak with her. He smiled during conversation with her as well. Discussed with daughter his current condition with approval from Nathan [name]  During discussion he appeared to be attentive to the discussion. He did continue to state he wasn't doing chemotherapy. Discussed and explained Palliative and hospice services with daughter. She verbalized understanding and stated she was planning to come see patient today and have a personal discussion. She verbalized that his current living environment was not of living conditions to return too. Patient explained that is all that he had as far as a home environment. Patient seemed relieved after having conversation with his daughter and providing her with updates. She was tearful during the conversation and acknowledge to him that she was there for him and cared about what was going on, also expressing that she didn't like when he tried to push her away and not let her know what was going on. He apologized for his past behaviors.   Spoke with daughter  over the phone outside of the room as well. She verbalized her intentions are to come to see her father on today or tomorrow.  She verbalized being aware that he was not willing to provide financial information in order to go to a facility for rehab or care, and that she spoke to him about this.  Daughter states patient is only source of income is SSI and that she could provide documentation if needed.  Daughter states she felt that it would be beneficial for him to go to a facility at least for period of time if that is was recommended.  She  verbalized her plan was to discuss with him what it is he wanted when she met with him on today.  We also discussed his CODE STATUS and advanced directives.  She stated that she will confirm his wishes when she spoke to him but at this time she was unable to speak to his wishes.  She verbalized yesterday he was somewhat angry even from her conversations with him.  He was very appreciative of our conversation and him allowing her to speak with me.  Chart reviewed and report received from bedside RN. Length of Stay: 5  Current Medications: Scheduled Meds:  . enoxaparin (LOVENOX) injection  40 mg Subcutaneous Q24H  . feeding supplement (ENSURE ENLIVE)  237 mL Oral TID BM  . ferrous sulfate  325 mg Oral Q breakfast  . folic acid  1 mg Oral Daily  . multivitamin with minerals  1 tablet Oral Daily  . nicotine  21 mg Transdermal Daily  . thiamine  100 mg Oral Daily  . vitamin B-12  1,000 mcg Oral Daily    Continuous Infusions: . piperacillin-tazobactam (ZOSYN)  IV 3.375 g (11/05/17 1113)  . potassium PHOSPHATE IVPB (mmol) 15 mmol (11/05/17 1137)    PRN Meds: albuterol, ALPRAZolam, bisacodyl, guaiFENesin-dextromethorphan, morphine injection, ondansetron **OR** ondansetron (ZOFRAN) IV, oxyCODONE, senna-docusate, traMADol  Physical Exam  Constitutional: He is oriented to person, place, and time. Vital signs are normal. He appears cachectic. He is cooperative.    Cardiovascular: Normal rate, regular rhythm, normal heart sounds, intact distal pulses and normal pulses.  Neurological: He is alert and oriented to person, place, and time.  Psychiatric: He has a normal mood and affect. His speech is normal and behavior is normal. Judgment and thought content normal. Cognition and memory are normal.  Pulmonary/Chest: Effort normal. He has decreased breath sounds.  Abdominal: Soft. Bowel sounds are normal.  Musculoskeletal:  Generalized weakness   Skin: Skin is warm and dry.  Nursing note and vitals reviewed.    Vital Signs: BP 101/72 (BP Location: Right Arm)   Pulse 94   Temp 97.7 F (36.5 C) (Oral)   Resp 17   Ht _0  (1.575 m)   Wt 51 kg (112 lb 8 oz)   SpO2 100%   BMI 20.58 kg/m  SpO2: SpO2: 100 % O2 Device: O2 Device: Nasal Cannula O2 Flow Rate: O2 Flow Rate (L/min): 2 L/min  Intake/output summary:   Intake/Output Summary (Last 24 hours) at 11/05/2017 1229 Last data filed at 11/05/2017 1037 Gross per 24 hour  Intake 410 ml  Output -  Net 410 ml   LBM: Last BM Date: 11/05/17 Baseline Weight: Weight: 52.2 kg (115 lb) Most recent weight: Weight: 51 kg (112 lb 8 oz)       Palliative Assessment/Data:PPS 40%      Patient Active Problem List   Diagnosis Date Noted  . Abdominal pain 10/31/2017  . Protein-calorie malnutrition, severe 10/31/2017  . Sepsis ([place]) 05/06/2017  . Severe recurrent major depression with psychotic features ([place]) 12/25/2014  . Severe major depression with psychotic features ([place]) 12/24/2014  . Alcohol abuse 12/24/2014  . Malnutrition ([place]) 12/24/2014    Palliative Care Assessment & Plan   Patient Profile: 62 y.o. male admitted on 10/30/2017 from home with abdominal pain and altered mental status. He has a past known medical history of hypertension, alcohol abuse, and tobacco use. During his Ed course patient was found to be alert to person and place, but not to month or year. He was  a poor historian, and  unable to provide accurate timeframe of his symptoms. Pt endorsed fatigue/malaise/generalized weakness, unsteady gait, lightheadedness on standing, and falls. At first he reported his symptoms had been going on for several weeks, but then he stated it has been going on for several months. He initially endorsed LOC, but then denied LOC later. He endorsed decreased exercise tolerance x~26mo and states he has been spending more time lying down. He endorsed diffuse AP, worst in the RUQ, and daily N/V, which he also believes has been ongoing x~259moHe endorsed decreased PO intake of food and weight loss, her reported he has been drinking soda and water. According to the patient he has been living with a friend for the past 2-20m69morior to that, he stated he was living with another friend, but that friend moved. He stated he used to smoke and drink, but does not anymore. He initially said he quit drinking ~1wk ago, but then said he quit drinking ~1-10mo27mo. He has not noticed any jaundice/icterus or edema. Pt denied F/C, diarrhea, CP, SOB, cough, hemoptysis, palpitations, diaphoresis, night sweats, rigors, HA, blurred vision, vertigo, urinary symptoms. He was unable to provide any furtherrelevantinformation. 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 He has a pending liver biopsy (11/01/17). Oncology has also seen patient. Palliative Medicine consulted for goals of care discussion.   Recommendations/Plan:  Full CODE: pending conversation with daughter and patient for clarity on patient's wishes.   CSW/CM to continue to work with patient and family regarding disposition home vs. SNF. Spoke daughter, Nathan Flatteryo verbalized having a discussion with her father and potentially agreeing to go to a facility for rehab.  Daughter states patient's income source is SSI would be able to get this information if needed.  Daughter plans to visit father on today and  have a discussion. She will update the nurse and medical team afterwards. I will follow-up with daughter tomorrow for any updates and overview of discussion.   Patient verbalized pain is being well controlled on current regimen.  Currently has not received any pain medication since yesterday morning.  Palliative medicine will continue to support patient and medical team during hospitalization.  Goals of Care and Additional Recommendations: Limitations on Scope of Treatment: Full Scope Treatment  At patient/daughter's request to continue to treat the treatable.  Code Status:    Code Status Orders  (From admission, onward)        Start     Ordered   10/31/17 0220  Full code  Continuous     10/31/17 0219    Code Status History    Date Active Date Inactive Code Status Order ID Comments User Context   05/06/2017 2037 05/08/2017 1711 Full Code [id number] Costa Inpatient   12/25/2014 0034 12/31/2014 1847 Full Code [id number], JohnMadie Reno Inpatient       Prognosis:   Unable to determine-guarded to poor in the setting of severe protein calorie malnutrition, alcohol abuse, poor p.o. intake, falls, generalized weakness, pain, and metastatic cancer.  Discharge Planning:  To Be Determined  Care plan was discussed with patient, patient's daughter, bedside RN and Dr. KoniVianne BullsThank you for allowing the Palliative Medicine Team to assist in the care of this patient.   Total Time 45mi20molonged Time Billed  NO      Greater than 50%  of this time was spent counseling and coordinating care  related to the above assessment and plan.  Alda Lea, NP  Please contact Palliative Medicine Team phone at [phone] for questions and concerns.

## 2017-11-05 NOTE — Progress Notes (Signed)
MEDICATION RELATED CONSULT NOTE -  Pharmacy Consult for electrolyte management Indication: hypokalemia  No Known Allergies  Patient Measurements: Height: 5\' 2"  (157.5 cm) Weight: 112 lb 8 oz (51 kg) IBW/kg (Calculated) : 54.6 Adjusted Body Weight:   Vital Signs: Temp: 97.7 F (36.5 C) (04/30 0334) Temp Source: Oral (04/30 0334) BP: 101/72 (04/30 0334) Pulse Rate: 94 (04/30 0334) Intake/Output from previous day: 04/29 0701 - 04/30 0700 In: 983.8 [P.O.:50; I.V.:683.8; IV Piggyback:250] Out: -  Intake/Output from this shift: No intake/output data recorded.  Labs: Recent Labs    11/03/17 1048 11/03/17 1225 11/04/17 0336 11/05/17 0418  WBC 11.7* 10.8*  --   --   HGB 8.8* 8.0*  --   --   HCT 27.1* 24.1*  --   --   PLT 160 146*  --   --   APTT  --  29  --   --   CREATININE  --  0.44* 0.39* 0.64  MG  --  1.6* 1.8 1.8  PHOS  --  1.1* 2.7 1.8*  ALBUMIN  --  1.8*  --   --   PROT  --  5.6*  --   --   AST  --  127*  --   --   ALT  --  95*  --   --   ALKPHOS  --  353*  --   --   BILITOT  --  0.8  --   --    Estimated Creatinine Clearance: 70.8 mL/min (by C-G formula based on SCr of 0.64 mg/dL).   Assessment: 62 yom cc abdominal pain with acute encephalopathy. Lactate elevated d/t liver disease/stomach mass (no obvious source of infection), although patient is on  Zosyn now for sepsis of unknown source.  4/29 K=3.5, Mag=1.8, Phos=1.8, Ca 7.1, Alb 1.8 (4/28), CorrCa 8.9  Goal of Therapy:  K 3.5 to 5 Ca 8.9 to 10.3 Mg 1.7 to 2.4 Phos 2.5 to 4.6  Plan:  Phos low at 1.8. Will order KPhos 15 mmol IV x1 (low body weight), which will also provide ~22 mEq of K. Will follow up on AM labs.  Rayna Sexton, PharmD, BCPS Clinical Pharmacist 11/05/2017 8:22 AM

## 2017-11-06 DIAGNOSIS — F05 Delirium due to known physiological condition: Secondary | ICD-10-CM

## 2017-11-06 DIAGNOSIS — R410 Disorientation, unspecified: Secondary | ICD-10-CM

## 2017-11-06 LAB — BASIC METABOLIC PANEL
Anion gap: 4 — ABNORMAL LOW (ref 5–15)
BUN: 13 mg/dL (ref 6–20)
CALCIUM: 7.7 mg/dL — AB (ref 8.9–10.3)
CO2: 27 mmol/L (ref 22–32)
CREATININE: 0.67 mg/dL (ref 0.61–1.24)
Chloride: 109 mmol/L (ref 101–111)
GLUCOSE: 82 mg/dL (ref 65–99)
Potassium: 3.5 mmol/L (ref 3.5–5.1)
Sodium: 140 mmol/L (ref 135–145)

## 2017-11-06 LAB — MAGNESIUM: Magnesium: 1.7 mg/dL (ref 1.7–2.4)

## 2017-11-06 LAB — CBC
HEMATOCRIT: 21.7 % — AB (ref 40.0–52.0)
Hemoglobin: 7.2 g/dL — ABNORMAL LOW (ref 13.0–18.0)
MCH: 25.8 pg — ABNORMAL LOW (ref 26.0–34.0)
MCHC: 33.1 g/dL (ref 32.0–36.0)
MCV: 77.9 fL — AB (ref 80.0–100.0)
PLATELETS: 197 10*3/uL (ref 150–440)
RBC: 2.79 MIL/uL — ABNORMAL LOW (ref 4.40–5.90)
RDW: 22.8 % — AB (ref 11.5–14.5)
WBC: 11 10*3/uL — ABNORMAL HIGH (ref 3.8–10.6)

## 2017-11-06 LAB — PHOSPHORUS: Phosphorus: 2.9 mg/dL (ref 2.5–4.6)

## 2017-11-06 MED ORDER — OLANZAPINE 5 MG PO TBDP
5.0000 mg | ORAL_TABLET | Freq: Every day | ORAL | Status: DC
  Administered 2017-11-06: 5 mg via ORAL
  Filled 2017-11-06 (×2): qty 1

## 2017-11-06 NOTE — Progress Notes (Signed)
Pharmacy consulted for electrolyte replacement protocol:   Goal of therapy: Electrolytes within normal limits:  K 3.5 - 5.1 Corrected Ca 8.9 - 10.3 Phos 2.5 - 4.6 Mg 1.7 - 2.4   Assessment: Lab Results  Component Value Date   CREATININE 0.67 11/06/2017   BUN 13 11/06/2017   NA 140 11/06/2017   K 3.5 11/06/2017   CL 109 11/06/2017   CO2 27 11/06/2017  Mg 1.7  Plan: Labs WNL, will recheck tomorrow with AM labs.    Thomasenia Sales, PharmD, MBA, [facility]

## 2017-11-06 NOTE — Progress Notes (Signed)
Physical Therapy Treatment Patient Details Name: Nathan Mcintosh MRN: [id number] DOB: 09-12-1955 Today's Date: 11/06/2017    History of Present Illness pt is a 62 y.o M with dx of abdominal pain addmitted on 10/30/2017. male with a known history of HTN, EtOH abuse, tobacco use D/O, malnourishment/cachexia who p/w AMS. The pt is alert to person and place, but not to month or year. pt is a poor historian regarding living situation and prior level of function. he is only Aware of person and place.     PT Comments    Pt agreeable to PT with encouragement, but ultimately only for exercises. Reports a "little bit of pain in stomach". Limited/poor movement throughout BLEs requiring numerous rest periods and continual encouragement to participate/perform. Recommendation changed to skilled nursing facility due to limited function and pt states he is unable to stand on legs for very long; however, pt notes he want to go home and is refusing placement.    Follow Up Recommendations  SNF(pt wishes to go home; refusing SNF)     Equipment Recommendations       Recommendations for Other Services       Precautions / Restrictions Precautions Precautions: Fall Restrictions Weight Bearing Restrictions: No    Mobility  Bed Mobility               General bed mobility comments: refuses  Transfers                    Ambulation/Gait                 Stairs             Wheelchair Mobility    Modified Rankin (Stroke Patients Only)       Balance                                            Cognition Arousal/Alertness: Awake/alert Behavior During Therapy: Flat affect Overall Cognitive Status: Difficult to assess                                 General Comments: little interaction/conversation. Stares at tv primarily. Mildly argumentative if asked to do more      Exercises General Exercises - Lower Extremity Ankle Circles/Pumps:  AROM;Both;10 reps;Supine Quad Sets: Strengthening;Both;10 reps;Supine Short Arc Quad: AROM;Both;10 reps;Supine Heel Slides: AROM;Both;10 reps;Supine Hip ABduction/ADduction: AROM;Both;10 reps;Supine Straight Leg Raises: Strengthening;Both;10 reps;Supine    General Comments        Pertinent Vitals/Pain Pain Assessment: (reports in stomach "a little bit") Pain Location: stomach    Home Living                      Prior Function            PT Goals (current goals can now be found in the care plan section) Progress towards PT goals: Not progressing toward goals - comment    Frequency    Min 2X/week      PT Plan Current plan remains appropriate    Co-evaluation              AM-PAC PT "6 Clicks" Daily Activity  Outcome Measure  Difficulty turning over in bed (including adjusting bedclothes, sheets and blankets)?: Unable Difficulty moving from lying on back to  sitting on the side of the bed? : Unable Difficulty sitting down on and standing up from a chair with arms (e.g., wheelchair, bedside commode, etc,.)?: Unable Help needed moving to and from a bed to chair (including a wheelchair)?: A Lot Help needed walking in hospital room?: A Lot Help needed climbing 3-5 steps with a railing? : A Lot 6 Click Score: 9    End of Session   Activity Tolerance: Patient limited by fatigue;Other (comment)("legs feel too heavy") Patient left: in bed;with call bell/phone within reach;with bed alarm set   PT Visit Diagnosis: Unsteadiness on feet (R26.81);History of falling (Z91.81);Adult, failure to thrive (R62.7);Other abnormalities of gait and mobility (R26.89);Muscle weakness (generalized) (M62.81)     Time: 3664-4034 PT Time Calculation (min) (ACUTE ONLY): 27 min  Charges:  $Therapeutic Exercise: 23-37 mins                    G Codes:        Larae Grooms, PTA 11/06/2017, 11:04 AM

## 2017-11-06 NOTE — Progress Notes (Signed)
Clinical Education officer, museum (CSW) received a call from [employer] level 2 Architect. CSW made [facility] aware that patient is refusing SNF. Per Magda Paganini she will have to cancel the level 2 [facility]ARR request.   [facility], LCSW [phone]

## 2017-11-06 NOTE — Progress Notes (Signed)
Hematology/Oncology Consult note [facility]  [phone] [phone]  Patient Care Team: Patient, No Pcp Per as PCP - General (General Practice)   Name of the patient: Nathan Mcintosh  [id number]  02-Apr-1956    Diagnosis- metastatic SCC unknown primary  Interval history- he countinues to feel very weak. Does not verbalize much today. Says he does not want to go back and live with his friend. Needs assistance to sit up in chair  ECOG PS- 3 Pain scale- 3 Opioid associated constipation- no  Review of systems- Review of Systems  Constitutional: Positive for malaise/fatigue and weight loss. Negative for chills and fever.  HENT: Negative for congestion, ear discharge and nosebleeds.   Eyes: Negative for blurred vision.  Respiratory: Negative for cough, hemoptysis, sputum production, shortness of breath and wheezing.   Cardiovascular: Negative for chest pain, palpitations, orthopnea and claudication.  Gastrointestinal: Positive for abdominal pain. Negative for blood in stool, constipation, diarrhea, heartburn, melena, nausea and vomiting.  Genitourinary: Negative for dysuria, flank pain, frequency, hematuria and urgency.  Musculoskeletal: Negative for back pain, joint pain and myalgias.  Skin: Negative for rash.  Neurological: Negative for dizziness, tingling, focal weakness, seizures, weakness and headaches.  Endo/Heme/Allergies: Does not bruise/bleed easily.  Psychiatric/Behavioral: Negative for depression and suicidal ideas. The patient does not have insomnia.      No Known Allergies   History reviewed. No pertinent past medical history.   History reviewed. No pertinent surgical history.  Social History   Socioeconomic History  . Marital status: Single    Spouse name: Not on file  . Number of children: Not on file  . Years of education: Not on file  . Highest education level: Not on file  Occupational History  . Not on  file  Social Needs  . Financial resource strain: Not on file  . Food insecurity:    Worry: Not on file    Inability: Not on file  . Transportation needs:    Medical: Not on file    Non-medical: Not on file  Tobacco Use  . Smoking status: Former Smoker    Packs/day: 1.00    Years: 30.00    Pack years: 30.00    Last attempt to quit: 12/25/2014    Years since quitting: 2.8  . Smokeless tobacco: Never Used  Substance and Sexual Activity  . Alcohol use: Not Currently    Alcohol/week: 2.4 - 3.0 oz    Types: 4 - 5 Cans of beer per week  . Drug use: No  . Sexual activity: Not Currently  Lifestyle  . Physical activity:    Days per week: Not on file    Minutes per session: Not on file  . Stress: Not on file  Relationships  . Social connections:    Talks on phone: Not on file    Gets together: Not on file    Attends religious service: Not on file    Active member of club or organization: Not on file    Attends meetings of clubs or organizations: Not on file    Relationship status: Not on file  . Intimate partner violence:    Fear of current or ex partner: Not on file    Emotionally abused: Not on file    Physically abused: Not on file    Forced sexual activity: Not on file  Other Topics Concern  . Not on file  Social History Narrative  . Not on file  History reviewed. No pertinent family history.   Current Facility-Administered Medications:  .  albuterol (PROVENTIL) (2.5 MG/3ML) 0.083% nebulizer solution 2.5 mg, 2.5 mg, Nebulization, Q6H PRN, Arta Silence, MD, 2.5 mg at 11/02/17 0851 .  ALPRAZolam Duanne Moron) tablet 0.25 mg, 0.25 mg, Oral, BID PRN, Epifanio Lesches, MD, 0.25 mg at 11/02/17 0902 .  bisacodyl (DULCOLAX) EC tablet 5 mg, 5 mg, Oral, Daily PRN, Jodell Cipro, Prasanna, MD .  enoxaparin (LOVENOX) injection 40 mg, 40 mg, Subcutaneous, Q24H, Gouru, Aruna, MD, 40 mg at 11/04/17 2040 .  feeding supplement (ENSURE ENLIVE) (ENSURE ENLIVE) liquid 237 mL, 237 mL,  Oral, TID BM, Epifanio Lesches, MD, 237 mL at 11/05/17 1942 .  ferrous sulfate tablet 325 mg, 325 mg, Oral, Q breakfast, Epifanio Lesches, MD, 325 mg at 11/05/17 1125 .  folic acid (FOLVITE) tablet 1 mg, 1 mg, Oral, Daily, Jodell Cipro, Prasanna, MD, 1 mg at 11/05/17 1113 .  guaiFENesin-dextromethorphan (ROBITUSSIN DM) 100-10 MG/5ML syrup 5 mL, 5 mL, Oral, Q4H PRN, Amelia Jo, MD .  morphine 2 MG/ML injection 2 mg, 2 mg, Intravenous, Q4H PRN, Gouru, Aruna, MD, 2 mg at 11/01/17 1959 .  multivitamin with minerals tablet 1 tablet, 1 tablet, Oral, Daily, Sridharan, Prasanna, MD, 1 tablet at 11/05/17 1113 .  nicotine (NICODERM CQ - dosed in mg/24 hours) patch 21 mg, 21 mg, Transdermal, Daily, Epifanio Lesches, MD, 21 mg at 11/05/17 1114 .  ondansetron (ZOFRAN) tablet 4 mg, 4 mg, Oral, Q6H PRN, 4 mg at 11/03/17 0956 **OR** ondansetron (ZOFRAN) injection 4 mg, 4 mg, Intravenous, Q6H PRN, Arta Silence, MD, 4 mg at 11/02/17 1730 .  oxyCODONE (Oxy IR/ROXICODONE) immediate release tablet 5 mg, 5 mg, Oral, Q4H PRN, Gouru, Aruna, MD, 5 mg at 11/03/17 0957 .  piperacillin-tazobactam (ZOSYN) IVPB 3.375 g, 3.375 g, Intravenous, Q8H, Epifanio Lesches, MD, Last Rate: 12.5 mL/hr at 11/06/17 0350, 3.375 g at 11/06/17 0350 .  senna-docusate (Senokot-S) tablet 1 tablet, 1 tablet, Oral, QHS PRN, Jodell Cipro, Prasanna, MD .  thiamine (VITAMIN B-1) tablet 100 mg, 100 mg, Oral, Daily, Jodell Cipro, Prasanna, MD, 100 mg at 11/05/17 1113 .  traMADol (ULTRAM) tablet 50 mg, 50 mg, Oral, Q6H PRN, Gouru, Aruna, MD .  vitamin B-12 (CYANOCOBALAMIN) tablet 1,000 mcg, 1,000 mcg, Oral, Daily, Arta Silence, MD, 1,000 mcg at 11/05/17 1113  Physical exam:  Vitals:   11/04/17 1916 11/05/17 0334 11/05/17 1946 11/06/17 0409  BP: 118/87 101/72 99/77 105/77  Pulse: (!) 115 94 84 98  Resp: 15 17 17 16   Temp: (!) 101.1 F (38.4 C) 97.7 F (36.5 C) 98.1 F (36.7 C) 98.5 F (36.9 C)  TempSrc: Oral Oral Oral Oral    SpO2: 100% 100% 100% 100%  Weight:      Height:       Physical Exam  Constitutional: He is oriented to person, place, and time.  Fatigued, cachectic  HENT:  Head: Normocephalic and atraumatic.  Eyes: Pupils are equal, round, and reactive to light. EOM are normal.  Neck: Normal range of motion.  Cardiovascular: Normal rate, regular rhythm and normal heart sounds.  Pulmonary/Chest: Effort normal and breath sounds normal.  Abdominal: Soft. Bowel sounds are normal.  Neurological: He is alert and oriented to person, place, and time.  Skin: Skin is warm and dry.     CMP Latest Ref Rng & Units 11/06/2017  Glucose 65 - 99 mg/dL 82  BUN 6 - 20 mg/dL 13  Creatinine 0.61 - 1.24 mg/dL 0.67  Sodium 135 - 145 mmol/L 140  Potassium 3.5 - 5.1 mmol/L 3.5  Chloride 101 - 111 mmol/L 109  CO2 22 - 32 mmol/L 27  Calcium 8.9 - 10.3 mg/dL 7.7(L)  Total Protein 6.5 - 8.1 g/dL -  Total Bilirubin 0.3 - 1.2 mg/dL -  Alkaline Phos 38 - 126 U/L -  AST 15 - 41 U/L -  ALT 17 - 63 U/L -   CBC Latest Ref Rng & Units 11/03/2017  WBC 3.8 - 10.6 K/uL 10.8(H)  Hemoglobin 13.0 - 18.0 g/dL 8.0(L)  Hematocrit 40.0 - 52.0 % 24.1(L)  Platelets 150 - 440 K/uL 146(L)    @IMAGES @  Ct Head Wo Contrast  Result Date: 10/31/2017 CLINICAL DATA:  Alcohol abuse and malnutrition. Severe weight loss, nausea and vomiting. EXAM: CT HEAD WITHOUT CONTRAST TECHNIQUE: Contiguous axial images were obtained from the base of the skull through the vertex without intravenous contrast. COMPARISON:  None. FINDINGS: BRAIN: There is sulcal and ventricular prominence consistent with superficial and central atrophy. No intraparenchymal hemorrhage, mass effect nor midline shift. Periventricular and subcortical white matter hypodensities consistent with chronic small vessel ischemic disease are identified. No acute large vascular territory infarcts. No abnormal extra-axial fluid collections. Basal cisterns are not effaced and midline. VASCULAR:  Moderate calcific atherosclerosis of the carotid siphons. SKULL: No skull fracture. No significant scalp soft tissue swelling. SINUSES/ORBITS: The mastoid air-cells are clear. The included paranasal sinuses are well-aerated.The included ocular globes and orbital contents are non-suspicious. OTHER: None. IMPRESSION: Atrophy with chronic appearing small vessel ischemic disease. No acute intracranial abnormality. Electronically Signed   By: Ashley Royalty M.D.   On: 10/31/2017 00:42   Ct Abdomen Pelvis W Contrast  Result Date: 10/31/2017 CLINICAL DATA:  Alcohol abuse, malnutrition and abdominal pain. EXAM: CT ABDOMEN AND PELVIS WITH CONTRAST TECHNIQUE: Multidetector CT imaging of the abdomen and pelvis was performed using the standard protocol following bolus administration of intravenous contrast. CONTRAST:  121mL ISOVUE-300 IOPAMIDOL (ISOVUE-300) INJECTION 61% COMPARISON:  02/11/2014 CT FINDINGS: Lower chest: The included heart size is normal. No pericardial effusion. Lung bases are clear. No effusion. There is a small hiatal hernia Hepatobiliary: There are numerous ill-defined and heterogeneously enhancing masses of the liver consistent with metastasis. No biliary dilatation. No portal or splenic vein thrombosis. The gallbladder is free of stones and physiologically distended without focal mural thickening. Pancreas: Atretic pancreas without ductal dilatation. Spleen: No splenomegaly.  No definite mass. Adrenals/Urinary Tract: Mild fullness of the left adrenal apex. No discrete adrenal mass. Bilateral renal cysts without nephrolithiasis hydroureteronephrosis. 2.7 cm left upper pole and 0.9 cm right upper pole renal cysts. Nondistended urinary bladder with mild circumferential mural thickening likely due to underdistention, less likely cystitis. Stomach/Bowel: Bulky exophytic enhancing mass off the expected location of the distal body and antrum of the stomach with areas of central necrosis is identified, measuring  roughly 10.7 x 9.9 x 7.9 cm. No gastric outlet obstruction is identified. Contrast extends into the small intestine without obstruction. What is visualized of the colon is unremarkable. Separate pelvic peritoneal necrotic mass to the right of the rectum with eccentric mural thickening involving the left lateral wall of this lesion and predominately necrotic or cystic component on the right overall measures 5.5 x 5.8 x 5.5 cm. Vascular/Lymphatic: No aortic aneurysm or dissection. Difficult to appreciate lymphadenopathy due to patient CT axial due to the bulky nature of aforementioned mass. Reproductive: Normal size prostate. Other: No hernia. No free air. Small amount of free fluid in the pelvis and abdomen. Musculoskeletal: No aggressive  osseous lesions. IMPRESSION: 1. Necrotic bulky exophytic appearing mass off the stomach measuring 10.7 x 9.9 x 7.9 cm with associated metastatic disease to the liver. Separate peritoneal metastatic mass deep within the pelvis adjacent to the rectum measuring 5.5 x 5.8 x 5.5 cm. 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 Other differential possibilities that may include lymphoma, gastric lymphoma, exophytic gastric carcinoma, or potentially soft tissue sarcoma invading stomach. Given the exophytic hypervascular, centrally necrotic appearance of this lesion off the stomach, favor a GIST tumor. 2. Simple bilateral renal cysts. Electronically Signed   By: Ashley Royalty M.D.   On: 10/31/2017 01:05   US Biopsy (liver)  Result Date: 11/01/2017 INDICATION: Liver metastatic disease EXAM: ULTRASOUND GUIDED LIVER BIOPSY MEDICATIONS: None. ANESTHESIA/SEDATION: None FLUOROSCOPY TIME:  Not applicable COMPLICATIONS: None immediate. PROCEDURE: Informed written consent was obtained from the patient after a thorough discussion of the procedural risks, benefits and alternatives. All questions were addressed. Maximal Sterile Barrier Technique was utilized  including caps, mask, sterile gowns, sterile gloves, sterile drape, hand hygiene and skin antiseptic. A timeout was performed prior to the initiation of the procedure. Utilizing 1% xylocaine as local anesthetic and real-time ultrasound guidance a 17 gauge guiding needle was placed percutaneously into the left lobe of the liver adjacent to 1 of the metastatic lesions. Multiple 18 gauge core biopsies were then obtained and sent for pathologic evaluation. The guiding needle was then removed and Gel-Foam slurry placed aid in hemostasis. The patient tolerated procedure well was returned his room in satisfactory condition. IMPRESSION: Successful ultrasound guided liver biopsy as described. Electronically Signed   By: Inez Catalina M.D.   On: 11/01/2017 10:35   Dg Chest Port 1 View  Result Date: 11/03/2017 CLINICAL DATA:  Former smoker.  Tachypneic. EXAM: PORTABLE CHEST 1 VIEW COMPARISON:  October 30, 2017 FINDINGS: The heart size and mediastinal contours are within normal limits. Both lungs are clear. The visualized skeletal structures are unremarkable. IMPRESSION: No active disease. Electronically Signed   By: Dorise Bullion III M.D   On: 11/03/2017 15:02   Dg Chest Portable 1 View  Result Date: 10/30/2017 CLINICAL DATA:  Cough EXAM: PORTABLE CHEST 1 VIEW COMPARISON:  05/06/2017 FINDINGS: Hyperinflation. No acute consolidation or effusion. Normal heart size. No pneumothorax. IMPRESSION: No active disease.  Hyperinflation. Electronically Signed   By: Donavan Foil M.D.   On: 10/30/2017 21:23     Assessment and plan- Patient is a 62 y.o. 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 LDH is significantly elevated. Surprisingly however his pathology shows squamous cell carcinoma which typically comes from lung, head and neck, esophagus, skin. He ideally needs PET scan as an outpatient to  ascertain primary source of his cancer  Currently his performance status is 3. He has severe malnutrition and cachexia. He is not a candidate for chemotherapy at this time. Chemotherapy would involve palliative carbo/taxol which could be associated with significant side effects such as nausea, vomtiing, neuropathy, risk of infections and hospitalizations. It would be palliative not curative. I have explained all this to the patient today. He seemed undecided about his wishes at this time.  Risks of chemotherapy outweigh possible benefits given his perfomance status and hospice/ best supportive care would be reasonable. His prognosis is <6 months. If his performance status improves at rehab/ ECF- we could certainly revisit this as an outpatient. I  did try calling his daughter [name] as she was not present at his bedside but I was unable to get hold of her. I will try calling again tomorrow   Visit Diagnosis 1. Right upper quadrant abdominal pain   2. Nausea and vomiting, intractability of vomiting not specified, unspecified vomiting type   3. Liver masses   4. Liver lesion   5. Tachypnea      Dr. Randa Evens, MD, MPH [facility] [phone]

## 2017-11-06 NOTE — Care Management (Addendum)
Spoke with patient about discharge disposition and he dozes through out the assessment attempt. He says he is going to reconsider facility placement then he says "Oh no- not that."  He is not able to tell CM where he will be going at discharge. He is not able to provide CM with any address.  Gave CM permission to call his daughter Nathan [name]  Nathan Mcintosh wants patient to "go to a home. The place he was staying was not good." Discussed patient can refuse facility place. Says that she does not think the patient "can make good decisions". She is not convinced he is able to fully understand "all this.'" She will be coming to the unit hopefully today.  She is having mechanical issues with her car.  CM is not convinced that patient is able to fully comprehend the aspects of his medical condition and serious doubt whether patient would be able manage his medical condition. Concerns whether patient has capacity to fully understand consequences of his less that optimal decisions Palliative has similar concerns.  Reached out to attending for psych consult

## 2017-11-06 NOTE — Consult Note (Signed)
[facility] Face-to-Face Psychiatry Consult   Reason for Consult: Consult for 62 year old man with concerns about his capacity to make decisions Referring Physician: Salary Patient Identification: Nathan Mcintosh MRN:  [id number] Principal Diagnosis: Subacute delirium Diagnosis:   Patient Active Problem List   Diagnosis Date Noted  . Subacute delirium [F05] 11/06/2017  . Abdominal pain [R10.9] 10/31/2017  . Protein-calorie malnutrition, severe [E43] 10/31/2017  . Sepsis ([place]) [A41.9] 05/06/2017  . 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 Severe major depression with psychotic features ([place]) [F32.3] 12/24/2014  . Alcohol abuse [F10.10] 12/24/2014  . Malnutrition ([place]) [E46] 12/24/2014    Total Time spent with patient: 1 hour  Subjective:   Nathan Mcintosh is a 62 y.o. male patient admitted with "I am okay I guess".  HPI: Patient interviewed.  Chart reviewed.  Especially reviewed old psychiatric notes.  There was a woman named Margreta Journey who is in the room who describes herself as a close friend of the patient as well who gave some useful history.  62 year old man came to the emergency room several days ago with vague and disorganized complaints abdominal pain clearly with a lot of weight loss poor self-care.  Subsequently has been found to have metastatic cancer.  Work is being done on finding the most appropriate disposition.  On interview today the patient was very slow quiet and able to provide only limited information.  He tells me that before coming to the hospital he was with "a friend".  At the same time he tells me he cannot go back there again.  Patient is not able to articulate his mood very well.  Answers questions sometimes inappropriately sometimes only with a few words.  At times he does seem to be lucid and interactive such as when he tells me that he would be agreeable to going to live with Margreta Journey who is in the room.  He is not able to  show evidence of understanding or articulating anything about a nursing home or other facility.  Medical history: Severe weight loss.  New diagnosis cancer.  Psychiatric history could patient has a history of psychotic depression.  Last hospitalization a few years ago.  Also a long history of abuse of alcohol.  Substance abuse history: Alcohol abuse.  Does not seem to have been using acutely at the time that he presented to the hospital.  Past Psychiatric History: Past history of psychotic symptoms with a diagnosis of psychotic depression.  At its worst patient was extremely paranoid and hallucinating during a previous hospitalization.  Risk to Self: Is patient at risk for suicide?: No Risk to Others:   Prior Inpatient Therapy:   Prior Outpatient Therapy:    Past Medical History: History reviewed. No pertinent past medical history. History reviewed. No pertinent surgical history. Family History: History reviewed. No pertinent family history. Family Psychiatric  History: Unknown Social History:  Social History   Substance and Sexual Activity  Alcohol Use Not Currently  . Alcohol/week: 2.4 - 3.0 oz  . Types: 4 - 5 Cans of beer per week     Social History   Substance and Sexual Activity  Drug Use No    Social History   Socioeconomic History  . Marital status: Single    Spouse name: Not on file  . Number of children: Not on file  . Years of education: Not on file  . Highest education level: Not on file  Occupational History  . Not on file  Social Needs  . Financial resource strain: Not on file  . Food insecurity:    Worry: Not on file    Inability: Not on file  . Transportation needs:    Medical: Not on file    Non-medical: Not on file  Tobacco Use  . Smoking status: Former Smoker    Packs/day: 1.00    Years: 30.00    Pack years: 30.00    Last attempt to quit: 12/25/2014    Years since quitting: 2.8  . Smokeless tobacco: Never Used  Substance and Sexual Activity   . Alcohol use: Not Currently    Alcohol/week: 2.4 - 3.0 oz    Types: 4 - 5 Cans of beer per week  . Drug use: No  . Sexual activity: Not Currently  Lifestyle  . Physical activity:    Days per week: Not on file    Minutes per session: Not on file  . Stress: Not on file  Relationships  . Social connections:    Talks on phone: Not on file    Gets together: Not on file    Attends religious service: Not on file    Active member of club or organization: Not on file    Attends meetings of clubs or organizations: Not on file    Relationship status: Not on file  Other Topics Concern  . Not on file  Social History Narrative  . Not on file   Additional Social History:    Allergies:  No Known Allergies  Labs:  Results for orders placed or performed during the hospital encounter of 10/30/17 (from the past 48 hour(s))  Procalcitonin     Status: None   Collection Time: 11/05/17  4:18 AM  Result Value Ref Range   Procalcitonin 2.28 ng/mL    Comment:        Interpretation: PCT > 2 ng/mL: Systemic infection (sepsis) is likely, unless other causes are known. (NOTE)       Sepsis PCT Algorithm           Lower Respiratory Tract                                      Infection PCT Algorithm    ----------------------------     ----------------------------         PCT < 0.25 ng/mL                PCT < 0.10 ng/mL         Strongly encourage             Strongly discourage   discontinuation of antibiotics    initiation of antibiotics    ----------------------------     -----------------------------       PCT 0.25 - 0.50 ng/mL            PCT 0.10 - 0.25 ng/mL               OR       >80% decrease in PCT            Discourage initiation of                                            antibiotics      Encourage discontinuation  of antibiotics    ----------------------------     -----------------------------         PCT >= 0.50 ng/mL              PCT 0.26 - 0.50 ng/mL               AND        <80% decrease in PCT              Encourage initiation of                                             antibiotics       Encourage continuation           of antibiotics    ----------------------------     -----------------------------        PCT >= 0.50 ng/mL                  PCT > 0.50 ng/mL               AND         increase in PCT                  Strongly encourage                                      initiation of antibiotics    Strongly encourage escalation           of antibiotics                                     -----------------------------                                           PCT <= 0.25 ng/mL                                                 OR                                        > 80% decrease in PCT                                     Discontinue / Do not initiate                                             antibiotics Performed at [facility], [address] [place], [place] [address]   Basic metabolic panel     Status: Abnormal   Collection Time: 11/05/17  4:18 AM  Result Value Ref Range   Sodium 138 135 - 145 mmol/L  Potassium 3.5 3.5 - 5.1 mmol/L   Chloride 109 101 - 111 mmol/L   CO2 22 22 - 32 mmol/L   Glucose, Bld 84 65 - 99 mg/dL   BUN 10 6 - 20 mg/dL   Creatinine, Ser 0.64 0.61 - 1.24 mg/dL   Calcium 7.1 (L) 8.9 - 10.3 mg/dL   GFR calc non Af Amer >60 >60 mL/min   GFR calc Af Amer >60 >60 mL/min    Comment: (NOTE) The eGFR has been calculated using the CKD EPI equation. This calculation has not been validated in all clinical situations. eGFR's persistently <60 mL/min signify possible Chronic Kidney Disease.    Anion gap 7 5 - 15    Comment: Performed at [facility], [address] [place], [place] [address]  Magnesium     Status: None   Collection Time: 11/05/17  4:18 AM  Result Value Ref Range   Magnesium 1.8 1.7 - 2.4 mg/dL    Comment: Performed at [facility], [address] [place], [place]  [address]  Phosphorus     Status: Abnormal   Collection Time: 11/05/17  4:18 AM  Result Value Ref Range   Phosphorus 1.8 (L) 2.5 - 4.6 mg/dL    Comment: Performed at [facility], [address] [place], [place] [address]  Basic metabolic panel     Status: Abnormal   Collection Time: 11/06/17  4:17 AM  Result Value Ref Range   Sodium 140 135 - 145 mmol/L   Potassium 3.5 3.5 - 5.1 mmol/L   Chloride 109 101 - 111 mmol/L   CO2 27 22 - 32 mmol/L   Glucose, Bld 82 65 - 99 mg/dL   BUN 13 6 - 20 mg/dL   Creatinine, Ser 0.67 0.61 - 1.24 mg/dL   Calcium 7.7 (L) 8.9 - 10.3 mg/dL   GFR calc non Af Amer >60 >60 mL/min   GFR calc Af Amer >60 >60 mL/min    Comment: (NOTE) The eGFR has been calculated using the CKD EPI equation. This calculation has not been validated in all clinical situations. eGFR's persistently <60 mL/min signify possible Chronic Kidney Disease.    Anion gap 4 (L) 5 - 15    Comment: Performed at [facility], [address] [place], [place] [address]  Magnesium     Status: None   Collection Time: 11/06/17  4:17 AM  Result Value Ref Range   Magnesium 1.7 1.7 - 2.4 mg/dL    Comment: Performed at [facility], [address] [place], [place] [address]  Phosphorus     Status: None   Collection Time: 11/06/17  4:17 AM  Result Value Ref Range   Phosphorus 2.9 2.5 - 4.6 mg/dL    Comment: Performed at [facility], [address] [place], [place] [address]  CBC     Status: Abnormal   Collection Time: 11/06/17  8:22 AM  Result Value Ref Range   WBC 11.0 (H) 3.8 - 10.6 K/uL   RBC 2.79 (L) 4.40 - 5.90 MIL/uL   Hemoglobin 7.2 (L) 13.0 - 18.0 g/dL   HCT 21.7 (L) 40.0 - 52.0 %   MCV 77.9 (L) 80.0 - 100.0 fL   MCH 25.8 (L) 26.0 - 34.0 pg   MCHC 33.1 32.0 - 36.0 g/dL   RDW 22.8 (H) 11.5 - 14.5 %   Platelets 197 150 - 440 K/uL    Comment: Performed at [facility], [address] [place],  [place] [address]    Current  Facility-Administered Medications  Medication Dose Route Frequency Provider Last Rate Last Dose  . albuterol (PROVENTIL) (2.5 MG/3ML) 0.083% nebulizer solution 2.5 mg  2.5 mg Nebulization Q6H PRN Arta Silence, MD   2.5 mg at 11/02/17 0851  . ALPRAZolam Duanne Moron) tablet 0.25 mg  0.25 mg Oral BID PRN Epifanio Lesches, MD   0.25 mg at 11/02/17 0902  . bisacodyl (DULCOLAX) EC tablet 5 mg  5 mg Oral Daily PRN Arta Silence, MD      . enoxaparin (LOVENOX) injection 40 mg  40 mg Subcutaneous Q24H Gouru, Aruna, MD   40 mg at 11/04/17 2040  . feeding supplement (ENSURE ENLIVE) (ENSURE ENLIVE) liquid 237 mL  237 mL Oral TID BM Epifanio Lesches, MD   237 mL at 11/06/17 1119  . ferrous sulfate tablet 325 mg  325 mg Oral Q breakfast Epifanio Lesches, MD   325 mg at 11/06/17 1118  . folic acid (FOLVITE) tablet 1 mg  1 mg Oral Daily Arta Silence, MD   1 mg at 11/06/17 1118  . guaiFENesin-dextromethorphan (ROBITUSSIN DM) 100-10 MG/5ML syrup 5 mL  5 mL Oral Q4H PRN Amelia Jo, MD      . morphine 2 MG/ML injection 2 mg  2 mg Intravenous Q4H PRN Gouru, Aruna, MD   2 mg at 11/01/17 1959  . multivitamin with minerals tablet 1 tablet  1 tablet Oral Daily Arta Silence, MD   1 tablet at 11/06/17 1118  . nicotine (NICODERM CQ - dosed in mg/24 hours) patch 21 mg  21 mg Transdermal Daily Epifanio Lesches, MD   21 mg at 11/06/17 1119  . OLANZapine zydis (ZYPREXA) disintegrating tablet 5 mg  5 mg Oral QHS Allan Bacigalupi T, MD      . ondansetron [facility]) tablet 4 mg  4 mg Oral Q6H PRN Arta Silence, MD   4 mg at 11/03/17 0956   Or  . ondansetron (ZOFRAN) injection 4 mg  4 mg Intravenous Q6H PRN Arta Silence, MD   4 mg at 11/02/17 1730  . oxyCODONE (Oxy IR/ROXICODONE) immediate release tablet 5 mg  5 mg Oral Q4H PRN Gouru, Aruna, MD   5 mg at 11/03/17 0957  . piperacillin-tazobactam (ZOSYN) IVPB 3.375 g  3.375 g Intravenous Q8H Epifanio Lesches, MD   Stopped at 11/06/17  1656  . senna-docusate (Senokot-S) tablet 1 tablet  1 tablet Oral QHS PRN Arta Silence, MD      . thiamine (VITAMIN B-1) tablet 100 mg  100 mg Oral Daily Arta Silence, MD   100 mg at 11/06/17 1118  . traMADol (ULTRAM) tablet 50 mg  50 mg Oral Q6H PRN Gouru, Aruna, MD      . vitamin B-12 (CYANOCOBALAMIN) tablet 1,000 mcg  1,000 mcg Oral Daily Arta Silence, MD   1,000 mcg at 11/06/17 1118    Musculoskeletal: Strength & Muscle Tone: decreased and atrophy Gait & Station: unable to stand Patient leans: Backward  Psychiatric Specialty Exam: Physical Exam  Nursing note and vitals reviewed. Constitutional: He appears well-developed.  HENT:  Head: Normocephalic and atraumatic.  Eyes: Pupils are equal, round, and reactive to light. Conjunctivae are normal.  Neck: Normal range of motion.  Cardiovascular: Regular rhythm and normal heart sounds.  Respiratory: Effort normal. No respiratory distress.  GI: Soft.  Musculoskeletal: Normal range of motion.  Neurological: He is alert.  Skin: Skin is warm and dry.  Psychiatric: His affect is blunt. His speech is delayed. He is slowed. Cognition  and memory are impaired. He is noncommunicative.    Review of Systems  Constitutional: Positive for malaise/fatigue and weight loss.  HENT: Negative.   Eyes: Negative.   Respiratory: Negative.   Cardiovascular: Negative.   Gastrointestinal: Negative.   Musculoskeletal: Negative.   Skin: Negative.   Neurological: Negative.   Psychiatric/Behavioral: Negative for suicidal ideas.    Blood pressure 110/80, pulse 98, temperature 98.7 F (37.1 C), temperature source Oral, resp. rate 16, height 5' 2"  (1.575 m), weight 51 kg (112 lb 8 oz), SpO2 100 %.Body mass index is 20.58 kg/m.  General Appearance: Casual  Eye Contact:  Minimal  Speech:  Slow and Slurred  Volume:  Decreased  Mood:  Euthymic  Affect:  Blunt  Thought Process:  Disorganized  Orientation:  NA  Thought Content:   Rumination  Suicidal Thoughts:  No  Homicidal Thoughts:  No  Memory:  Immediate;   Fair Recent;   Poor Remote;   Poor  Judgement:  Impaired  Insight:  Shallow  Psychomotor Activity:  Decreased  Concentration:  Concentration: Poor  Recall:  Poor  Fund of Knowledge:  Poor  Language:  Fair  Akathisia:  No  Handed:  Right  AIMS (if indicated):     Assets:  Social Support  ADL's:  Impaired  Cognition:  Impaired,  Moderate and Severe  Sleep:        Treatment Plan Summary: Plan 62 year old man with past history of psychotic depression and alcohol abuse now in the hospital with end-stage cancer.  Treatment team is arguing for placement and hospice follow-up.  Patient had stated his resistance to this.  Based on the interview today I do not think the patient understands his situation.  He is not able to articulate an understanding of where he is or where he would go after being in the hospital.  On the other hand he did appear to recognize the woman in the room and to state that he would be glad to go and live with her.  I had a nice conversation with this woman who told me that she and her husband would be willing to let the patient stay in their house and that she believes she has the approval of the patient's daughter and this as well.  Patient certainly has made it clear that he would prefer not to be in a nursing home.  My suggestion would be that if possible arrangements be made to try and get him into the home of his trusted friend if that seems appropriate but at this point he does not really seem to have the capacity to make any decision and certainly could not have the capacity to make a decision to live independently.  At the same time I am restarting Zyprexa which had been very helpful in the past this time at a lower dose of 5 mg at night.  We will follow-up.  Disposition: Supportive therapy provided about ongoing stressors.  Alethia Berthold, MD 11/06/2017 8:02 PM

## 2017-11-06 NOTE — Progress Notes (Signed)
Nutrition Follow-up  DOCUMENTATION CODES:   Severe malnutrition in context of chronic illness  INTERVENTION:  Continue dysphagia 3 diet (mechanical soft) with thin liquids in setting of poor dentition.  Continue Ensure Enlive po TID, each supplement provides 350 kcal and 20 grams of protein. Patient prefers vanilla.  Continue Magic cup TID with meals, each supplement provides 290 kcal and 9 grams of protein. Patient prefers vanilla.  Continue MVI daily, thiamine 073 mg daily, folic acid 1 mg daily.  Encouraged adequate intake of calories and protein from meals and oral nutrition supplements. Discussed foods on menu that are high in calories and protein.  NUTRITION DIAGNOSIS:   Severe Malnutrition related to chronic illness(stomach mass with possible liver mets, possible chronic liver disease, alchohol abuse) as evidenced by severe muscle depletion, severe fat depletion.  Ongoing - addressing with mechanical soft diet, Ensure Enlive, and [employer].  GOAL:   Patient will meet greater than or equal to 90% of their needs  Progressing.  MONITOR:   PO intake, Supplement acceptance, Labs, I & O's, Weight trends  REASON FOR ASSESSMENT:   Malnutrition Screening Tool    ASSESSMENT:   62 y.o. Male with hx of malnutrition, alcohol abuse, sepsis, altered mental status presents to ED with reports of abdominal pain, daily N&V, and significant weight loss. Admitted with acute encephalopathy, and stomach mass with possible liver mets discovered.    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 Oncology is recommending a hospice approach given his poor performance status, the fact that chemotherapy would only be palliative in nature, and risk of side effects from the chemotherapy. Apparently patient has not decided what he would want to do yet. -Pending psychiatry evaluation for competency.  Met with patient at bedside. He was sitting up in bed attempting  to eat his meal. He reports his appetite is still poor. He is tolerating the mechanical soft diet. He reports he "eats some meals, but doesn't eat others." He is unable to give very many details on his intake. He reports that he likes the Ensure supplements, but is unable to report how many of those he is drinking each day. Unsure how good of a historian he is.  Meal Completion: 50-80%  Medications reviewed and include: ferrous sulfate 710 mg daily, folic acid 1 mg daily, MVI daily, thiamine 100 mg daily, vitamin B12 1000 micrograms daily, Zosyn.  Labs reviewed: Potassium, Phosphorus, and Magnesium all WNL today.  No weight since admission to trend.  Discussed with RN. Patient did have breakfast this morning.  Diet Order:   Diet Order           DIET DYS 3 Room service appropriate? Yes with Assist; Fluid consistency: Thin  Diet effective now          EDUCATION NEEDS:   Not appropriate for education at this time  Skin:  Skin Assessment: Reviewed RN Assessment  Last BM:  11/06/2017 - medium type 6  Height:   Ht Readings from Last 1 Encounters:  10/31/17 '5\' 2"'$  (1.575 m)    Weight:   Wt Readings from Last 1 Encounters:  10/31/17 112 lb 8 oz (51 kg)    Ideal Body Weight:  53.6 kg  BMI:  Body mass index is 20.58 kg/m.  Estimated Nutritional Needs:   Kcal:  1700-1950 kcal (MSJ ABW x 1.4-1.6)  Protein:  76-87 g/kg/day (ABW x 1.5-1.7)  Fluid:  >1.6 L/day (30 mL/kg/day)  Willey Blade, MS, RD, LDN Office: [phone] Pager:  [phone] After Hours/Weekend Pager: [phone]

## 2017-11-06 NOTE — Progress Notes (Signed)
Nathan Mcintosh at [place] NAME: Nathan Mcintosh    MR#:  [id number]  DATE OF BIRTH:  [birth year]  Abdominal pain, poor p.o. intake.  CHIEF COMPLAINT: He complains of midepigastric abdominal pain.  REVIEW OF SYSTEMS:  CONSTITUTIONAL: Weight loss, abdominal pain  eYES: No blurred or double vision.  EARS, NOSE, AND THROAT: No tinnitus or ear pain.  RESPIRATORY: No shortness of breath or cough. CARDIOVASCULAR: No chest pain, orthopnea, edema.  GASTROINTESTINAL: nausea, vomiting, diarrhea.  Reporting upper abdominal pain.  GENITOURINARY: No dysuria, hematuria.  ENDOCRINE: No polyuria, nocturia,  HEMATOLOGY: No anemia, easy bruising or bleeding SKIN: No rash or lesion. MUSCULOSKELETAL: No joint pain or arthritis.   NEUROLOGIC: No tingling, numbness, weakness.  PSYCHIATRY: No anxiety or depression.   DRUG ALLERGIES:  No Known Allergies  VITALS:  Blood pressure 105/77, pulse (!) 108, temperature 98.5 F (36.9 C), temperature source Oral, resp. rate 16, height 5\' 2"  (1.575 m), weight 51 kg (112 lb 8 oz), SpO2 100 %.  PHYSICAL EXAMINATION:  GENERAL:  62 y.o.-year-old patient lying in the bed with no acute distress.  Cachectic severe muscle wasting  EYES: Pupils equal, round, reactive to light and accommodation. No scleral icterus. Extraocular muscles intact.  HEENT: Head atraumatic, normocephalic. Oropharynx and nasopharynx clear.  NECK:  Supple, no jugular venous distention. No thyroid enlargement, no tenderness.  LUNGS: Normal breath sounds bilaterally, no wheezing,  No rales,rhonchi or crepitation. No use of accessory muscles of respiration.  CARDIOVASCULAR: S1, S2 normal. No murmurs, rubs, or gallops.  ABDOMEN: Soft, epigastric tenderness is present no rebound tenderness . dressing present in  abdomen., nondistended. Bowel sounds present.  Liver biopsy site looks intact EXTREMITIES: No pedal edema, cyanosis, or clubbing.  NEUROLOGIC:  Cranial nerves II through XII are intact. Muscle strength global weakness in all extremities. Sensation intact. Gait not checked.  PSYCHIATRIC: The patient is alert and oriented x 3.  SKIN: No obvious rash, lesion, or ulcer.    LABORATORY PANEL:   CBC Recent Labs  Lab 11/06/17 0822  WBC 11.0*  HGB 7.2*  HCT 21.7*  PLT 197   ------------------------------------------------------------------------------------------------------------------  Chemistries  Recent Labs  Lab 11/03/17 1225  11/06/17 0417  NA 137   < > 140  K 3.1*   < > 3.5  CL 105   < > 109  CO2 24   < > 27  GLUCOSE 121*   < > 82  BUN 7   < > 13  CREATININE 0.44*   < > 0.67  CALCIUM 7.3*   < > 7.7*  MG 1.6*   < > 1.7  AST 127*  --   --   ALT 95*  --   --   ALKPHOS 353*  --   --   BILITOT 0.8  --   --    < > = values in this interval not displayed.   ------------------------------------------------------------------------------------------------------------------  Cardiac Enzymes Recent Labs  Lab 10/30/17 2212  TROPONINI <0.03   ------------------------------------------------------------------------------------------------------------------  RADIOLOGY:  No results found.  EKG:   Orders placed or performed during the hospital encounter of 10/30/17  . ED EKG  . ED EKG    ASSESSMENT AND PLAN:     A/P: 55M AMS/encephalopathy, stomach/liver mass. #1 acute encephalopathy resolved. 2.  Abdominal pain found to have exophytic large gastric mass about 10 cm with liver mets, peritoneal mets.  Patient had a liver biopsy results are showing squamous cell carcinoma as per my  discussion with oncology.. Prognosis really poor, chemotherapy will be palliative chemotherapy given that can give you him nausea and vomiting, given his poor performance status oncology is recommending hospice care but patient not decided about that yet.  Trying to reach patient's daughter.  Also getting psychiatry for competency  evaluation.  neoplasm related pain.  Continue as needed narcotics. 4.  Fever, worsening sepsis: Chest x-ray clear, blood cultures have been negative.  Patient septic because of underlying cancer, prognosis really poor, continue empiric antibiotics, palliative care is following, at this time he is not decided about comfort care.  But patient is to be comfort care and possibly hospice home.   appreciate oncology following, discussed with Dr. Janese Banks.  All the records are reviewed and case discussed with Care Management/Social Workerr.   CODE STATUS: fc, daughter daughter is the healthcare POA  TOTAL TIME TAKING CARE OF THIS PATIENT: 36  minutes.   POSSIBLE D/C IN 2DAYS, DEPENDING ON CLINICAL CONDITION.  Note: This dictation was prepared with Dragon dictation along with smaller phrase technology. Any transcriptional errors that result from this process are unintentional.   Epifanio Lesches M.D on 11/06/2017 at 2:09 PM  Between 7am to 6pm - Pager - [phone] After 6pm go to [email or web address] - password [email or web address]  Office  [phone]  CC: Primary care physician; Patient, No Pcp Per

## 2017-11-07 LAB — MAGNESIUM: Magnesium: 1.7 mg/dL (ref 1.7–2.4)

## 2017-11-07 MED ORDER — ALPRAZOLAM 0.25 MG PO TABS: 0.2500 mg | ORAL_TABLET | Freq: Two times a day (BID) | ORAL | 0 refills | Status: AC | PRN

## 2017-11-07 MED ORDER — OXYCODONE HCL 5 MG PO TABS: 5.0000 mg | ORAL_TABLET | ORAL | 0 refills | Status: AC | PRN

## 2017-11-07 MED ORDER — OLANZAPINE 5 MG PO TBDP: 5.0000 mg | ORAL_TABLET | Freq: Every day | ORAL | 0 refills | Status: AC

## 2017-11-07 MED ORDER — GUAIFENESIN-DM 100-10 MG/5ML PO SYRP: 5.0000 mL | ORAL_SOLUTION | ORAL | 0 refills | Status: AC | PRN

## 2017-11-07 MED ORDER — ACETAMINOPHEN 325 MG PO TABS
650.0000 mg | ORAL_TABLET | Freq: Four times a day (QID) | ORAL | Status: DC | PRN
  Administered 2017-11-07: 05:00:00 650 mg via ORAL
  Filled 2017-11-07: qty 2

## 2017-11-07 NOTE — Discharge Instructions (Signed)
[facility] °

## 2017-11-07 NOTE — Progress Notes (Signed)
Daily Progress Note   Patient Name: Nathan Mcintosh       Date: 11/07/2017 DOB: 1956/03/09  Age: 62 y.o. MRN#: [id number] Attending Physician: Gorden Harms, MD Primary Care Physician: Patient, No Pcp Per Admit Date: 10/30/2017  Reason for Consultation/Follow-up: Establishing goals of care  Subjective: Patient is awake and alert and oriented x3. He is more talkative and engages more in conversation than other days.  He is sitting up on the side of the bed eating breakfast and visiting with friends. Patient was observed ambulating in hall with PT and the use of a rolling walker. Seems to be have tolerated well. He verbalized that he felt good walking, and it relieved some of his back pain and pressure. Denies any pain or discomfort at this time while sitting up. His close family friends are at the bedside Nathan Mcintosh and Nathan [name] Patient verbalizes that these are the family members that he is going to be discharged with and to live with. He states he would like for advance directives to also be completed naming Nathan Mcintosh and Nathan Mcintosh as his HCPOA. Nann, CM spoke with daughter and she is aware of disposition arrangements and plans. Dr. Jerelyn Charles aware and verbalized patient is competent to make his own decisions at this time.   I met at the bedside with patient, Nathan Mcintosh, and Nathan Mcintosh to review and discuss his discharge plans and a review of goals of care. Patient again verbalized his wishes to name Nathan Mcintosh and Nathan Mcintosh as his HCPOA. Patient verbalized his plans to be discharged to their homes and received hospice care there. He continues to state he is not interested in receiving chemotherapy or treatment regarding his newly diagnosed cancer. We discussed his current nutritional status and will to not eat. He  verbalizes his appetite has somewhat improved now that he is feeling better, and that he is going to try to eat the best that he can when he gets home. He states he is no longer smoking or drinking alcohol either, and states it has been over a month since he has, with no plans of restarting. He verbalizes his disappointment in some of his past life choices and styles, but his goal is to do what is right and best for his health with the time that he has left. We re-discussed his wishes in  regards to code status. Patient verbalized he was glad that his friends was present so that they would have an understanding of what it was he was wanting for his life. He stated "If I die just let me die! I do not want nobody pumping on my chest or putting me on any machines to try to keep me alive. We all have to die at some point and if it is my time don't interrupt God's work!" His family was tearful after hearing this statement yet verbalized their understanding of his wishes. I discussed in detail further repeating to him and his family his wishes for DNR/DNI and what that meant and expectations when that times comes. He verbalized understanding and stated that is what he wanted proceeding to tell his family to make sure that they cremate him as well. Informed patient that I would change his code status to DNR and that he would be discharged with the gold out of facility DNR form. Patient and family verbalized understanding and purpose of document. Patient verbalized he was comfortable with is decisions. He is also aware that team has spoken with his daughter Nathan [name]   Chart reviewed and report received from bedside RN. Length of Stay: 7  Current Medications: Scheduled Meds:  . enoxaparin (LOVENOX) injection  40 mg Subcutaneous Q24H  . feeding supplement (ENSURE ENLIVE)  237 mL Oral TID BM  . ferrous sulfate  325 mg Oral Q breakfast  . folic acid  1 mg Oral Daily  . multivitamin with minerals  1 tablet Oral Daily  .  nicotine  21 mg Transdermal Daily  . OLANZapine zydis  5 mg Oral QHS  . thiamine  100 mg Oral Daily  . vitamin B-12  1,000 mcg Oral Daily    Continuous Infusions: . piperacillin-tazobactam (ZOSYN)  IV Stopped (11/07/17 0943)    PRN Meds: acetaminophen, albuterol, ALPRAZolam, bisacodyl, guaiFENesin-dextromethorphan, morphine injection, ondansetron **OR** ondansetron (ZOFRAN) IV, oxyCODONE, senna-docusate, traMADol   Physical Exam  Constitutional: He is oriented to person, place, and time. Vital signs are normal. He appears cachectic. He is cooperative.  Cardiovascular: Normal rate, regular rhythm, normal heart sounds, intact distal pulses and normal pulses.  Neurological: He is alert and oriented to person, place, and time.  Psychiatric: He has a normal mood and affect. His speech is normal and behavior is normal. Judgment and thought content normal. Cognition and memory are normal.  Pulmonary/Chest:Effort normal. He hasdecreased breath sounds.  Abdominal:Soft.Bowel sounds are normal.  Musculoskeletal: Generalized weakness, ambulatory in halls with PT and rolling walker Skin: Skin iswarmand dry.  Nursing noteand vitalsreviewed.   Vital Signs: BP 129/90 (BP Location: Right Arm)   Mcintosh (!) 118   Temp 99.8 F (37.7 C) (Oral)   Resp 20   Ht _0  (1.575 m)   Wt 51 kg (112 lb 8 oz)   SpO2 100%   BMI 20.58 kg/m  SpO2: SpO2: 100 % O2 Device: O2 Device: Room Air O2 Flow Rate: O2 Flow Rate (L/min): 2 L/min  Intake/output summary:   Intake/Output Summary (Last 24 hours) at 11/07/2017 1141 Last data filed at 11/07/2017 0943 Gross per 24 hour  Intake 50 ml  Output 100 ml  Net -50 ml   LBM: Last BM Date: 11/07/17 Baseline Weight: Weight: 52.2 kg (115 lb) Most recent weight: Weight: 51 kg (112 lb 8 oz)       Palliative Assessment/Data:PPS 40%    Flowsheet Rows     Most Recent Value  Intake Tab  Referral Department  Hospitalist  Unit at Time of Referral  Oncology  Unit  Palliative Care Primary Diagnosis  Cancer  Date Notified  11/02/17  Palliative Care Type  New Palliative care  Reason for referral  Clarify Goals of Care  Date of Admission  10/30/17  Date first seen by Palliative Care  11/04/17  # of days Palliative referral response time  2 Day(s)  # of days IP prior to Palliative referral  3  Clinical Assessment  Psychosocial & Spiritual Assessment  Palliative Care Outcomes      Patient Active Problem List   Diagnosis Date Noted  . Subacute delirium 11/06/2017  . Abdominal pain 10/31/2017  . Protein-calorie malnutrition, severe 10/31/2017  . Sepsis ([place]) 05/06/2017  . Severe recurrent major depression with psychotic features ([place]) 12/25/2014  . Severe major depression with psychotic features ([place]) 12/24/2014  . Alcohol abuse 12/24/2014  . Malnutrition ([place]) 12/24/2014    Palliative Care Assessment & Plan   Patient Profile: 62 y.o. male admitted on 10/30/2017 from home with abdominal pain and altered mental status. He has a past known medical history of hypertension, alcohol abuse, and tobacco use. During his Ed course patient was found to be alert to person and place, but not to month or year. He was a poor historian, and unable to provide accurate timeframe of his symptoms. Pt endorsed fatigue/malaise/generalized weakness, unsteady gait, lightheadedness on standing, and falls. At first he reported his symptoms had been going on for several weeks, but then he stated it has been going on for several months. He initially endorsed LOC, but then denied LOC later. He endorsed decreased exercise tolerance x~11mo and states he has been spending more time lying down. He endorsed diffuse AP, worst in the RUQ, and daily N/V, which he also believes has been ongoing x~274moHe endorsed decreased PO intake of food and weight loss, her reported he has been drinking soda and water. According to the patient he has been living with a friend for the past 2-46m9moPrior to that, he stated he was living with another friend, but that friend moved. He stated he used to smoke and drink, but does not anymore. He initially said he quit drinking ~1wk ago, but then said he quit drinking ~1-61mo561mo. He has not noticed any jaundice/icterus or edema. Pt denied F/C, diarrhea, CP, SOB, cough, hemoptysis, palpitations, diaphoresis, night sweats, rigors, HA, blurred vision, vertigo, urinary symptoms. He was unable to provide any furtherrelevantinformation. 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 He has a pending liver biopsy (11/01/17). Oncology has also seen patient. Palliative Medicine consulted for goals of care discussion.   Recommendations/Plan:  DNR/DNI-at patient's request. (Form completed and placed on chart)  Nann, Case Manager and [facility]antiago Glad [facility] continue to work with patient and family regarding disposition home with hospice. Patient is requesting to be discharged home with [place], [place] with decision. States he is in need of rolling walker and bedside commode.   Chaplain consult for assistance with completion of advanced directives.   Patient verbalized pain is being well controlled on current regimen.    Palliative medicine will continue to support patient and medical team during hospitalization as needed. Please call.  Goals of Care and Additional Recommendations: Limitations on Scope of Treatment: Full Scope Treatment  At patient/daughter's request to continue to treat the treatable.  Code Status:    Code  Status Orders  (From admission, onward)        Start     Ordered   10/31/17 0220  Full code  Continuous     10/31/17 0219    Code Status History    Date Active Date Inactive Code Status Order ID Comments User Context   05/06/2017 2037 05/08/2017 1711 Full Code [id number]  Bettey Costa, MD Inpatient   12/25/2014 0034 12/31/2014 1847  Full Code [id number]  Clapacs, Madie Reno, MD Inpatient       Prognosis:   Unable to determine-guarded to poor in the setting of severe protein calorie malnutrition, alcohol abuse, poor p.o. intake, falls, generalized weakness, pain, and metastatic cancer.  Discharge Planning:  Home with Hospice services. Patient, daughter, and family friends are aware that he will be discharged home with Nathan Mcintosh and Nathan [name]   Care plan was discussed with patient, patient's daughter, family friends, bedside RN, Santiago Glad, RN (Hospice), [place], [place], and Dr. Jerelyn Charles.   Thank you for allowing the Palliative Medicine Team to assist in the care of this patient.   Total Time  70 min Prolonged Time Billed  Yes      Greater than 50%  of this time was spent counseling and coordinating care related to the above assessment and plan.  Alda Lea, NP  Please contact Palliative Medicine Team phone at [phone] for questions and concerns.

## 2017-11-07 NOTE — Clinical Social Work Note (Signed)
CSW received referral for SNF.  Case discussed with case manager and plan is to discharge home with home hospice.  CSW to sign off please re-consult if social work needs arise.  Jancy Sprankle R. Patricia Fargo, MSW, LCSWA [phone]  

## 2017-11-07 NOTE — Discharge Summary (Signed)
[facility] NAME: Nathan Mcintosh    MR#:  [id number]  DATE OF BIRTH:  [birth year]  DATE OF ADMISSION:  10/30/2017 ADMITTING PHYSICIAN: Max Sane, MD  DATE OF DISCHARGE: No discharge date for patient encounter.  PRIMARY CARE PHYSICIAN: Patient, No Pcp Per    ADMISSION DIAGNOSIS:  Right upper quadrant abdominal pain [R10.11] Nausea and vomiting, intractability of vomiting not specified, unspecified vomiting type [R11.2]  DISCHARGE DIAGNOSIS:  Principal Problem:   Subacute delirium Active Problems:   Severe major depression with psychotic features (HCC)   Malnutrition (HCC)   Abdominal pain   Protein-calorie malnutrition, severe   SECONDARY DIAGNOSIS:  History reviewed. No pertinent past medical history.  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 Prognosis terminal Status post liver biopsy Seen by oncology-patient with incurable condition, given his poor performance status oncology is recommending hospice care, seen by psychiatry and deemed to have capacity, patient is competent regarding current issues, all questions answered on the day of discharge in front of his family friends whom wish to take him home with hospice care Case management/palliative care was consulted while in house   *Acute fevers/sepsis  Treated empirically with IV Zosyn while in-house-discontinued given hospice status   DISCHARGE CONDITIONS:  Condition stable, prognosis terminal care of family and friends with hospice services  CONSULTS OBTAINED:  Treatment Team:  Arta Silence, MD Sindy Guadeloupe, MD Clapacs, Madie Reno, MD Nicolis Boody, Avel Peace, MD  DRUG ALLERGIES:  No Known Allergies  DISCHARGE MEDICATIONS:   Allergies as of 11/07/2017   No Known Allergies     Medication List    STOP taking these medications   amoxicillin-clavulanate 875-125 MG  tablet Commonly known as:  AUGMENTIN   metoprolol tartrate 25 MG tablet Commonly known as:  LOPRESSOR     TAKE these medications   albuterol 108 (90 Base) MCG/ACT inhaler Commonly known as:  PROVENTIL HFA;VENTOLIN HFA Inhale 2 puffs into the lungs every 4 (four) hours as needed for wheezing.   ALPRAZolam 0.25 MG tablet Commonly known as:  XANAX Take 1 tablet (0.25 mg total) by mouth 2 (two) times daily as needed for anxiety.   guaiFENesin-dextromethorphan 100-10 MG/5ML syrup Commonly known as:  ROBITUSSIN DM Take 5 mLs by mouth every 4 (four) hours as needed for cough (chest congestion).   OLANZapine zydis 5 MG disintegrating tablet Commonly known as:  ZYPREXA Take 1 tablet (5 mg total) by mouth at bedtime.   oxyCODONE 5 MG immediate release tablet Commonly known as:  Oxy IR/ROXICODONE Take 1 tablet (5 mg total) by mouth every 4 (four) hours as needed for moderate pain.        DISCHARGE INSTRUCTIONS:    If you experience worsening of your admission symptoms, develop shortness of breath, life threatening emergency, suicidal or homicidal thoughts you must seek medical attention immediately by calling 911 or calling your MD immediately  if symptoms less severe.  You Must read complete instructions/literature along with all the possible adverse reactions/side effects for all the Medicines you take and that have been prescribed to you. Take any new Medicines after you have completely understood and accept all the possible adverse reactions/side effects.   Please note  You were cared for by a hospitalist during your hospital stay. If you have any questions about your discharge medications or the care you received while you were in the hospital after  you are discharged, you can call the unit and asked to speak with the hospitalist on call if the hospitalist that took care of you is not available. Once you are discharged, your primary care physician will handle any further medical  issues. Please note that NO REFILLS for any discharge medications will be authorized once you are discharged, as it is imperative that you return to your primary care physician (or establish a relationship with a primary care physician if you do not have one) for your aftercare needs so that they can reassess your need for medications and monitor your lab values.    Today   CHIEF COMPLAINT:   Chief Complaint  Patient presents with  . Weakness    HISTORY OF PRESENT ILLNESS:   62 y.o. male with a known history of HTN, EtOH abuse, tobacco use D/O, malnourishment/cachexia who p/w AMS. The pt is alert to person and place, but not to month or year. He is a poor historian, and is unable to provide accurate timeframe of symptoms. Pt endorses fatigue/malaise/generalized weakness, unsteady gait, lightheadedness on standing, and falls. At first he states this has been going on for several weeks, but then he states it has been going on for several months. He initially endorsed LOC, but then denied LOC later. He endorses decreased exercise tolerance x~14mo, and states he has been spending more time lying down. He endorses diffuse AP, worst in the RUQ, and daily N/V, which he also believes has been ongoing x~68mo. He endorses decreased PO intake of food and weight loss, but states he has been drinking soda and water. He states he has been living with a friend for the past 2-32mo. Prior to that, he says he was living with another friend, but that friend moved. He states he used to smoke and drink, but does not anymore. He initially said he quit drinking ~1wk ago, but then said he quit drinking ~1-24mo ago. He has not noticed any jaundice/icterus or edema. Pt denies F/C, diarrhea, CP, SOB, cough, hemoptysis, palpitations, diaphoresis, night sweats, rigors, HA, blurred vision, vertigo, urinary symptoms. He is unable to provide any further relevant information.    VITAL SIGNS:  Blood pressure 129/90, pulse (!) 118,  temperature 99.8 F (37.7 C), temperature source Oral, resp. rate 20, height 5\' 2"  (1.575 m), weight 51 kg (112 lb 8 oz), SpO2 100 %.  I/O:    Intake/Output Summary (Last 24 hours) at 11/07/2017 1235 Last data filed at 11/07/2017 0943 Gross per 24 hour  Intake 50 ml  Output 100 ml  Net -50 ml    PHYSICAL EXAMINATION:  GENERAL:  62 y.o.-year-old patient lying in the bed with no acute distress.  EYES: Pupils equal, round, reactive to light and accommodation. No scleral icterus. Extraocular muscles intact.  HEENT: Head atraumatic, normocephalic. Oropharynx and nasopharynx clear.  NECK:  Supple, no jugular venous distention. No thyroid enlargement, no tenderness.  LUNGS: Normal breath sounds bilaterally, no wheezing, rales,rhonchi or crepitation. No use of accessory muscles of respiration.  CARDIOVASCULAR: S1, S2 normal. No murmurs, rubs, or gallops.  ABDOMEN: Soft, non-tender, non-distended. Bowel sounds present. No organomegaly or mass.  EXTREMITIES: No pedal edema, cyanosis, or clubbing.  NEUROLOGIC: Cranial nerves II through XII are intact. Muscle strength 5/5 in all extremities. Sensation intact. Gait not checked.  PSYCHIATRIC: The patient is alert and oriented x 3.  SKIN: No obvious rash, lesion, or ulcer.   DATA REVIEW:   CBC Recent Labs  Lab 11/06/17 0822  WBC  11.0*  HGB 7.2*  HCT 21.7*  PLT 197    Chemistries  Recent Labs  Lab 11/03/17 1225  11/06/17 0417 11/07/17 0427  NA 137   < > 140  --   K 3.1*   < > 3.5  --   CL 105   < > 109  --   CO2 24   < > 27  --   GLUCOSE 121*   < > 82  --   BUN 7   < > 13  --   CREATININE 0.44*   < > 0.67  --   CALCIUM 7.3*   < > 7.7*  --   MG 1.6*   < > 1.7 1.7  AST 127*  --   --   --   ALT 95*  --   --   --   ALKPHOS 353*  --   --   --   BILITOT 0.8  --   --   --    < > = values in this interval not displayed.    Cardiac Enzymes No results for input(s): TROPONINI in the last 168 hours.  Microbiology Results  Results for  orders placed or performed during the hospital encounter of 10/30/17  Blood Culture (routine x 2)     Status: None   Collection Time: 10/30/17 12:49 AM  Result Value Ref Range Status   Specimen Description BLOOD BLOOD LEFT FOREARM  Final   Special Requests   Final    BOTTLES DRAWN AEROBIC AND ANAEROBIC Blood Culture results may not be optimal due to an inadequate volume of blood received in culture bottles   Culture   Final    NO GROWTH 5 DAYS Performed at [facility], [address] [place], [place] [address]    Report Status 11/05/2017 FINAL  Final  Blood Culture (routine x 2)     Status: Abnormal   Collection Time: 10/30/17 12:49 AM  Result Value Ref Range Status   Specimen Description   Final    BLOOD RIGHT ANTECUBITAL Performed at [facility], [address], [place], [place] [address]    Special Requests   Final    BOTTLES DRAWN AEROBIC AND ANAEROBIC Blood Culture results may not be optimal due to an inadequate volume of blood received in culture bottles Performed at [facility], [address] [place], [place] [address]    Culture  Setup Time   Final    GRAM POSITIVE COCCI AEROBIC BOTTLE ONLY CRITICAL RESULT CALLED TO, READ BACK BY AND VERIFIED WITH: DAVID BESANTI @ 4235 ON 11/01/2017 BY CAF    Culture (A)  Final    STAPHYLOCOCCUS SPECIES (COAGULASE NEGATIVE) THE SIGNIFICANCE OF ISOLATING THIS ORGANISM FROM A SINGLE SET OF BLOOD CULTURES WHEN MULTIPLE SETS ARE DRAWN IS UNCERTAIN. PLEASE NOTIFY THE MICROBIOLOGY DEPARTMENT WITHIN ONE WEEK IF SPECIATION AND SENSITIVITIES ARE REQUIRED. Performed at [facility], [place] [address], [place], [place] [address]    Report Status 11/03/2017 FINAL  Final  Blood Culture ID Panel (Reflexed)     Status: Abnormal   Collection Time: 10/30/17 12:49 AM  Result Value Ref Range Status   Enterococcus species NOT DETECTED NOT DETECTED Final   Listeria monocytogenes NOT DETECTED NOT DETECTED Final    Staphylococcus species DETECTED (A) NOT DETECTED Final    Comment: Methicillin (oxacillin) resistant coagulase negative staphylococcus. Possible blood culture contaminant (unless isolated from more than one blood culture draw or clinical case suggests pathogenicity).  No antibiotic treatment is indicated for blood  culture contaminants. CRITICAL RESULT CALLED TO, READ BACK BY AND VERIFIED WITH: DAVID BESANTI @ 0133 ON 11/01/2017 BY CAF    Staphylococcus aureus NOT DETECTED NOT DETECTED Final   Methicillin resistance DETECTED (A) NOT DETECTED Final    Comment: CRITICAL RESULT CALLED TO, READ BACK BY AND VERIFIED WITH: DAVID BESANTI @ 0133 ON 11/01/2017 BY CAF    Streptococcus species NOT DETECTED NOT DETECTED Final   Streptococcus agalactiae NOT DETECTED NOT DETECTED Final   Streptococcus pneumoniae NOT DETECTED NOT DETECTED Final   Streptococcus pyogenes NOT DETECTED NOT DETECTED Final   Acinetobacter baumannii NOT DETECTED NOT DETECTED Final   Enterobacteriaceae species NOT DETECTED NOT DETECTED Final   Enterobacter cloacae complex NOT DETECTED NOT DETECTED Final   Escherichia coli NOT DETECTED NOT DETECTED Final   Klebsiella oxytoca NOT DETECTED NOT DETECTED Final   Klebsiella pneumoniae NOT DETECTED NOT DETECTED Final   Proteus species NOT DETECTED NOT DETECTED Final   Serratia marcescens NOT DETECTED NOT DETECTED Final   Haemophilus influenzae NOT DETECTED NOT DETECTED Final   Neisseria meningitidis NOT DETECTED NOT DETECTED Final   Pseudomonas aeruginosa NOT DETECTED NOT DETECTED Final   Candida albicans NOT DETECTED NOT DETECTED Final   Candida glabrata NOT DETECTED NOT DETECTED Final   Candida krusei NOT DETECTED NOT DETECTED Final   Candida parapsilosis NOT DETECTED NOT DETECTED Final   Candida tropicalis NOT DETECTED NOT DETECTED Final    Comment: Performed at [facility], [address] [place], [place] [address]  Culture, blood (x 2)     Status: None (Preliminary  result)   Collection Time: 11/03/17 12:25 PM  Result Value Ref Range Status   Specimen Description BLOOD LT [facility]  Final   Special Requests   Final    BOTTLES DRAWN AEROBIC AND ANAEROBIC Blood Culture results may not be optimal due to an inadequate volume of blood received in culture bottles   Culture   Final    NO GROWTH 4 DAYS Performed at [facility], [address], [place], [place] [address]    Report Status PENDING  Incomplete  Culture, blood (x 2)     Status: None (Preliminary result)   Collection Time: 11/03/17 12:34 PM  Result Value Ref Range Status   Specimen Description BLOOD LT HAND  Final   Special Requests   Final    BOTTLES DRAWN AEROBIC AND ANAEROBIC Blood Culture results may not be optimal due to an inadequate volume of blood received in culture bottles   Culture   Final    NO GROWTH 4 DAYS Performed at [facility], [address] [place], [place] [address]    Report Status PENDING  Incomplete  MRSA PCR Screening     Status: None   Collection Time: 11/03/17  1:35 PM  Result Value Ref Range Status   MRSA by PCR NEGATIVE NEGATIVE Final    Comment:        The GeneXpert MRSA Assay (FDA approved for NASAL specimens only), is one component of a comprehensive MRSA colonization surveillance program. It is not intended to diagnose MRSA infection nor to guide or monitor treatment for MRSA infections. Performed at [facility], [address], [place], [place] [address]     RADIOLOGY:  No results found.  EKG:   Orders placed or performed during the hospital encounter of 10/30/17  . ED EKG  . ED EKG      Management plans  discussed with the patient, family and they are in agreement.  CODE STATUS:     Code Status Orders  (From admission, onward)        Start     Ordered   11/07/17 1211  Do not attempt resuscitation (DNR)  Continuous    Question Answer Comment  In the event of cardiac or respiratory ARREST Do not call a  "code blue"   In the event of cardiac or respiratory ARREST Do not perform Intubation, CPR, defibrillation or ACLS   In the event of cardiac or respiratory ARREST Use medication by any route, position, wound care, and other measures to relive pain and suffering. May use oxygen, suction and manual treatment of airway obstruction as needed for comfort.      11/07/17 1210    Code Status History    Date Active Date Inactive Code Status Order ID Comments User Context   10/31/2017 0219 11/07/2017 1210 Full Code [id number]  Arta Silence, MD Inpatient   05/06/2017 2037 05/08/2017 1711 Full Code [id number]  Bettey Costa, MD Inpatient   12/25/2014 0034 12/31/2014 1847 Full Code [id number]  Clapacs, Madie Reno, MD Inpatient      TOTAL TIME TAKING CARE OF THIS PATIENT: 45 minutes.    Avel Peace Fidelia Cathers M.D on 11/07/2017 at 12:35 PM  Between 7am to 6pm - Pager - [phone]  After 6pm go to [email or web address] - password [email or web address] [facility]  Office  [phone]  CC: Primary care physician; Patient, No Pcp Per   Note: This dictation was prepared with Dragon dictation along with smaller phrase technology. Any transcriptional errors that result from this process are unintentional.

## 2017-11-07 NOTE — Progress Notes (Signed)
Pharmacy Antibiotic Note  Nathan Mcintosh is a 62 y.o. male admitted on 10/30/2017 with pneumonia and IAI.  Pharmacy has been consulted for Zosyn dosing.  Plan: 1. Zosyn 3.375 gm IV Q8H EI  Height: 5\' 2"  (157.5 cm) Weight: 112 lb 8 oz (51 kg) IBW/kg (Calculated) : 54.6  Temp (24hrs), Avg:99.3 F (37.4 C), Min:98.2 F (36.8 C), Max:100.5 F (38.1 C)  Recent Labs  Lab 11/03/17 1048 11/03/17 1225 11/04/17 0336 11/05/17 0418 11/06/17 0417 11/06/17 0822  WBC 11.7* 10.8*  --   --   --  11.0*  CREATININE  --  0.44* 0.39* 0.64 0.67  --   LATICACIDVEN 3.4* 2.8*  --   --   --   --     Estimated Creatinine Clearance: 70.8 mL/min (by C-G formula based on SCr of 0.67 mg/dL).    No Known Allergies  Antimicrobials this admission: Anti-infectives (From admission, onward)   Start     Dose/Rate Route Frequency Ordered Stop   11/03/17 2200  vancomycin (VANCOCIN) IVPB 750 mg/150 ml premix  Status:  Discontinued     750 mg 150 mL/hr over 60 Minutes Intravenous Every 12 hours 11/03/17 1251 11/04/17 1457   11/03/17 1200  vancomycin (VANCOCIN) IVPB 1000 mg/200 mL premix     1,000 mg 200 mL/hr over 60 Minutes Intravenous  Once 11/03/17 1156 11/03/17 1431   11/03/17 1200  piperacillin-tazobactam (ZOSYN) IVPB 3.375 g     3.375 g 12.5 mL/hr over 240 Minutes Intravenous Every 8 hours 11/03/17 1156     10/30/17 2330  cefTRIAXone (ROCEPHIN) 1 g in sodium chloride 0.9 % 100 mL IVPB     1 g 200 mL/hr over 30 Minutes Intravenous  Once 10/30/17 2326 10/31/17 0129       Dose adjustments this admission:   Microbiology results: Recent Results (from the past 240 hour(s))  Blood Culture (routine x 2)     Status: None   Collection Time: 10/30/17 12:49 AM  Result Value Ref Range Status   Specimen Description BLOOD BLOOD LEFT FOREARM  Final   Special Requests   Final    BOTTLES DRAWN AEROBIC AND ANAEROBIC Blood Culture results may not be optimal due to an inadequate volume of blood received in  culture bottles   Culture   Final    NO GROWTH 5 DAYS Performed at [facility], [address], [place], [place] [address]    Report Status 11/05/2017 FINAL  Final  Blood Culture (routine x 2)     Status: Abnormal   Collection Time: 10/30/17 12:49 AM  Result Value Ref Range Status   Specimen Description   Final    BLOOD RIGHT ANTECUBITAL Performed at [facility], [address] [place], [place] [address]    Special Requests   Final    BOTTLES DRAWN AEROBIC AND ANAEROBIC Blood Culture results may not be optimal due to an inadequate volume of blood received in culture bottles Performed at [facility], [address] [place], [place] [address]    Culture  Setup Time   Final    GRAM POSITIVE COCCI AEROBIC BOTTLE ONLY CRITICAL RESULT CALLED TO, READ BACK BY AND VERIFIED WITH: DAVID BESANTI @ 0133 ON 11/01/2017 BY CAF    Culture (A)  Final    STAPHYLOCOCCUS SPECIES (COAGULASE NEGATIVE) THE SIGNIFICANCE OF ISOLATING THIS ORGANISM FROM A SINGLE SET OF BLOOD CULTURES WHEN MULTIPLE SETS ARE DRAWN IS UNCERTAIN. PLEASE NOTIFY THE MICROBIOLOGY DEPARTMENT WITHIN ONE WEEK IF SPECIATION  AND SENSITIVITIES ARE REQUIRED. Performed at [facility], [place] [address], [place], [place] [address]    Report Status 11/03/2017 FINAL  Final  Blood Culture ID Panel (Reflexed)     Status: Abnormal   Collection Time: 10/30/17 12:49 AM  Result Value Ref Range Status   Enterococcus species NOT DETECTED NOT DETECTED Final   Listeria monocytogenes NOT DETECTED NOT DETECTED Final   Staphylococcus species DETECTED (A) NOT DETECTED Final    Comment: Methicillin (oxacillin) resistant coagulase negative staphylococcus. Possible blood culture contaminant (unless isolated from more than one blood culture draw or clinical case suggests pathogenicity). No antibiotic treatment is indicated for blood  culture contaminants. CRITICAL RESULT CALLED TO, READ BACK BY AND VERIFIED WITH: DAVID  BESANTI @ 0133 ON 11/01/2017 BY CAF    Staphylococcus aureus NOT DETECTED NOT DETECTED Final   Methicillin resistance DETECTED (A) NOT DETECTED Final    Comment: CRITICAL RESULT CALLED TO, READ BACK BY AND VERIFIED WITH: DAVID BESANTI @ 0133 ON 11/01/2017 BY CAF    Streptococcus species NOT DETECTED NOT DETECTED Final   Streptococcus agalactiae NOT DETECTED NOT DETECTED Final   Streptococcus pneumoniae NOT DETECTED NOT DETECTED Final   Streptococcus pyogenes NOT DETECTED NOT DETECTED Final   Acinetobacter baumannii NOT DETECTED NOT DETECTED Final   Enterobacteriaceae species NOT DETECTED NOT DETECTED Final   Enterobacter cloacae complex NOT DETECTED NOT DETECTED Final   Escherichia coli NOT DETECTED NOT DETECTED Final   Klebsiella oxytoca NOT DETECTED NOT DETECTED Final   Klebsiella pneumoniae NOT DETECTED NOT DETECTED Final   Proteus species NOT DETECTED NOT DETECTED Final   Serratia marcescens NOT DETECTED NOT DETECTED Final   Haemophilus influenzae NOT DETECTED NOT DETECTED Final   Neisseria meningitidis NOT DETECTED NOT DETECTED Final   Pseudomonas aeruginosa NOT DETECTED NOT DETECTED Final   Candida albicans NOT DETECTED NOT DETECTED Final   Candida glabrata NOT DETECTED NOT DETECTED Final   Candida krusei NOT DETECTED NOT DETECTED Final   Candida parapsilosis NOT DETECTED NOT DETECTED Final   Candida tropicalis NOT DETECTED NOT DETECTED Final    Comment: Performed at [facility], [address] [place], [place] [address]  Culture, blood (x 2)     Status: None (Preliminary result)   Collection Time: 11/03/17 12:25 PM  Result Value Ref Range Status   Specimen Description BLOOD LT [facility]  Final   Special Requests   Final    BOTTLES DRAWN AEROBIC AND ANAEROBIC Blood Culture results may not be optimal due to an inadequate volume of blood received in culture bottles   Culture   Final    NO GROWTH 4 DAYS Performed at [facility], [address] [place],  [place] [address]    Report Status PENDING  Incomplete  Culture, blood (x 2)     Status: None (Preliminary result)   Collection Time: 11/03/17 12:34 PM  Result Value Ref Range Status   Specimen Description BLOOD LT HAND  Final   Special Requests   Final    BOTTLES DRAWN AEROBIC AND ANAEROBIC Blood Culture results may not be optimal due to an inadequate volume of blood received in culture bottles   Culture   Final    NO GROWTH 4 DAYS Performed at [facility], [address], [place], [place] [address]    Report Status PENDING  Incomplete  MRSA PCR Screening     Status: None   Collection Time: 11/03/17  1:35 PM  Result Value Ref Range  Status   MRSA by PCR NEGATIVE NEGATIVE Final    Comment:        The GeneXpert MRSA Assay (FDA approved for NASAL specimens only), is one component of a comprehensive MRSA colonization surveillance program. It is not intended to diagnose MRSA infection nor to guide or monitor treatment for MRSA infections. Performed at [facility], [address], [place], [place] [address]      Thank you for allowing pharmacy to be a part of this patient's care.  Susan Bleich, Pharm.D., BCPS Clinical Pharmacist 11/07/2017 9:07 AM

## 2017-11-07 NOTE — Progress Notes (Signed)
Pharmacy consulted for electrolyte replacement protocol:   Goal of therapy: Electrolytes within normal limits:  K 3.5 - 5.1 Corrected Ca 8.9 - 10.3 Phos 2.5 - 4.6 Mg 1.7 - 2.4   Assessment: Lab Results  Component Value Date   CREATININE 0.67 11/06/2017   BUN 13 11/06/2017   NA 140 11/06/2017   K 3.5 11/06/2017   CL 109 11/06/2017   CO2 27 11/06/2017  Mg 1.7  Plan: Labs WNL will recheck tomorrow morning with AM labs    Thomasenia Sales, PharmD, MBA, [facility]

## 2017-11-07 NOTE — Progress Notes (Signed)
MD order received for pt to be discharged home with hospice today; Hospice services with [facility] previously established by Delia Chimes; verbally reviewed AVS with pt; includes out of facility DNR, Rxs and AVS; no questions voiced at this time; pt discharged via wheelchair by auxillary to the visitor's entrance

## 2017-11-07 NOTE — Progress Notes (Signed)
Chaplain responded to a page for an AD. Pt is not able to answer qualifying questions. Waiting for family to pick up pt on discharge. Chaplain will explain to family.   11/07/17 1400  Clinical Encounter Type  Visited With Patient  Visit Type Initial  Referral From Nurse  Spiritual Encounters  Spiritual Needs Brochure

## 2017-11-07 NOTE — Progress Notes (Signed)
New referral for [facility] services at home received from [facility]. Patient is a 62 year old man with a past medical history of HTN, alcohol abuse and tobacco abuse admitted to [facility] on 4/24 for evaluation of abdominal pain and weakness. 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 Palliative medicine was consulted for goals of care and has been following this hospitalization. Palliative NP and attending physician Dr. Jerelyn Charles met with patient and his friends this morning an they have chosen for patient to discharge home with the support of Hospice services. Patient agreed to DNR code status this morning. Signed DNR in place in shadow chart. CMRN Hoy Finlay also spoke with [name], patient's daughter who confirmed the choice of hospice care. [name] also confirmed that patient will discharge to their home. Writer met in the room with patient, Altha Harm and [place] to initiate education regarding hospice services, philosophy and team approach to care with understanding voiced. Questions answered. DME needs recommended by PT rolling walker and BSC ordered. Patient was alert and sitting on the side of the bed, he did not participate fully in the conversation, but did voice understanding of plan of care. Hospice information and contact numbers given to [facility]. Plan is for discharge home today by car. Patient information faxed to referral. Signed DNR to accompany patient home.  Flo Shanks RN, BSN, [place] [facility], hospital Liaison [phone]

## 2017-11-07 NOTE — Care Management (Signed)
Found not to have capacity by psych - but at this point he does not really seem to have the capacity to make any decision and certainly could not have the capacity to make a decision to live independently    CM reaching out to daughter [name] to find contact information for "Margreta Journey" who was present during to psych evaluation yesterday and who has relayed that patient can discharge to her home.

## 2017-11-07 NOTE — Progress Notes (Signed)
Physical Therapy Treatment Patient Details Name: Nathan Mcintosh MRN: [id number] DOB: 18-May-1956 Today's Date: 11/07/2017    History of Present Illness pt is a 62 y.o M with dx of abdominal pain addmitted on 10/30/2017. male with a known history of HTN, EtOH abuse, tobacco use D/O, malnourishment/cachexia who p/w AMS. The pt is alert to person and place, but not to month or year. pt is a poor historian regarding living situation and prior level of function. he is only Aware of person and place.     PT Comments    Pt bed.  Initially refusing gait and but agreed to get to recliner.  Min a +1 for tactile cues to initiate movement.  Family arrived during transition and was able to encourage pt to walk today.  Pt stood with min guard/assist +1 and was able to walk a full lap around unit with walker and min assist +1.  Upon return to room pt did c/o some dizziness.  Refused to sit in recliner.  Remained sitting EOB for breakfast with his family in attendance.  Primary nurse aware of sitting EOB.  Discharge disposition remains unresolved.  SNF is appropriate if 24 hour assist from family is not available as he does not seem motivated or able to care for himself.    Family is in and stated they can provide care and plan to take him home.  They have a wheelchair if needed but will need a RW and 3-in -1 commode.  They attended session and were encouraged to have +1 assist for mobility.     Follow Up Recommendations  SNF, other HHPT, 24 hour assist with assist for mobility.  See above      Equipment Recommendations  Rolling walker with 5" wheels , 3 in 1 commode.   Recommendations for Other Services       Precautions / Restrictions Precautions Precautions: Fall Restrictions Weight Bearing Restrictions: No    Mobility  Bed Mobility Overal bed mobility: Needs Assistance Bed Mobility: Supine to Sit     Supine to sit: Min guard;Min assist     General bed mobility comments: pt able to do  without assist.  needs tactile cues to initiate movement  Transfers Overall transfer level: Needs assistance Equipment used: Rolling walker (2 wheeled) Transfers: Sit to/from Stand Sit to Stand: Min assist            Ambulation/Gait Ambulation/Gait assistance: Min assist Ambulation Distance (Feet): 180 Feet Assistive device: Rolling walker (2 wheeled) Gait Pattern/deviations: Step-through pattern;Decreased step length - right;Decreased step length - left;Narrow base of support Gait velocity: decreased       Stairs             Wheelchair Mobility    Modified Rankin (Stroke Patients Only)       Balance Overall balance assessment: Needs assistance Sitting-balance support: Feet supported Sitting balance-Leahy Scale: Good     Standing balance support: Bilateral upper extremity supported Standing balance-Leahy Scale: Fair                              Cognition Arousal/Alertness: Awake/alert Behavior During Therapy: Flat affect Overall Cognitive Status: Difficult to assess                                        Exercises      General Comments  Pertinent Vitals/Pain Pain Assessment: 0-10 Pain Score: 5  Pain Location: back Pain Descriptors / Indicators: Sore Pain Intervention(s): Repositioned;Monitored during session    Home Living                      Prior Function            PT Goals (current goals can now be found in the care plan section) Progress towards PT goals: Progressing toward goals    Frequency    Min 2X/week      PT Plan Current plan remains appropriate;Other (comment)    Co-evaluation              AM-PAC PT "6 Clicks" Daily Activity  Outcome Measure  Difficulty turning over in bed (including adjusting bedclothes, sheets and blankets)?: None Difficulty moving from lying on back to sitting on the side of the bed? : None Difficulty sitting down on and standing up from a  chair with arms (e.g., wheelchair, bedside commode, etc,.)?: A Little Help needed moving to and from a bed to chair (including a wheelchair)?: A Little Help needed walking in hospital room?: A Little Help needed climbing 3-5 steps with a railing? : A Lot 6 Click Score: 19    End of Session Equipment Utilized During Treatment: Gait belt Activity Tolerance: Patient tolerated treatment well;Treatment limited secondary to agitation Patient left: in bed;with call bell/phone within reach;with family/visitor present Nurse Communication: Mobility status       Time: 1034-1050 PT Time Calculation (min) (ACUTE ONLY): 16 min  Charges:  $Gait Training: 8-22 mins                    G Codes:       Chesley Noon, PTA 11/07/17, 11:16 AM

## 2017-11-07 NOTE — Care Management (Signed)
Patient is more alert and verbally appropriate.  Attending has stated that the patient today is competent.  Spoke with daughter [name] and informed her of psych consult yesterday and that patient is mentally clear this morning.  Discussed discharge to the home of Margreta Journey and [place] with home hospice services with [name].  She says that she would want this couple to make the medical decisions for the patient.  Christina and patient in agreement. Will attempt to have HCPOA forms completed. [name] says would like for patient to remain full code but patient wishes to be DNR. Patient verbalizes agreement with home hospice services. [facility] has accepted the referral .  Medical director Dr Gilford Rile will follow with hospice

## 2017-11-08 LAB — CULTURE, BLOOD (ROUTINE X 2)
CULTURE: NO GROWTH
Culture: NO GROWTH

## 2017-12-07 DEATH — deceased
